# Patient Record
Sex: Male | Born: 1975 | Race: Black or African American | Hispanic: No | Smoking: Never smoker
Health system: Southern US, Community
[De-identification: ages and names within clinical notes are randomized; demographics above are authoritative.]

## PROBLEM LIST (undated history)

## (undated) DIAGNOSIS — E119 Type 2 diabetes mellitus without complications: Secondary | ICD-10-CM

---

## 2002-07-09 ENCOUNTER — Encounter: Payer: Self-pay | Admitting: Emergency Medicine

## 2002-07-09 ENCOUNTER — Emergency Department (HOSPITAL_COMMUNITY): Admission: EM | Admit: 2002-07-09 | Discharge: 2002-07-09 | Payer: Self-pay | Admitting: Emergency Medicine

## 2003-12-12 ENCOUNTER — Emergency Department (HOSPITAL_COMMUNITY): Admission: EM | Admit: 2003-12-12 | Discharge: 2003-12-12 | Payer: Self-pay | Admitting: *Deleted

## 2017-11-16 ENCOUNTER — Encounter (HOSPITAL_COMMUNITY): Payer: Self-pay | Admitting: *Deleted

## 2017-11-16 ENCOUNTER — Emergency Department (HOSPITAL_COMMUNITY): Payer: Medicaid Other

## 2017-11-16 ENCOUNTER — Other Ambulatory Visit: Payer: Self-pay

## 2017-11-16 ENCOUNTER — Inpatient Hospital Stay (HOSPITAL_COMMUNITY)
Admission: EM | Admit: 2017-11-16 | Discharge: 2017-11-27 | DRG: 617 | Disposition: A | Discharge status: Skilled Nursing Facility | Payer: Medicaid Other | Attending: Family Medicine | Admitting: Family Medicine

## 2017-11-16 DIAGNOSIS — E1140 Type 2 diabetes mellitus with diabetic neuropathy, unspecified: Secondary | ICD-10-CM | POA: Diagnosis present

## 2017-11-16 DIAGNOSIS — L089 Local infection of the skin and subcutaneous tissue, unspecified: Secondary | ICD-10-CM

## 2017-11-16 DIAGNOSIS — K567 Ileus, unspecified: Secondary | ICD-10-CM | POA: Diagnosis not present

## 2017-11-16 DIAGNOSIS — D62 Acute posthemorrhagic anemia: Secondary | ICD-10-CM | POA: Diagnosis not present

## 2017-11-16 DIAGNOSIS — E1152 Type 2 diabetes mellitus with diabetic peripheral angiopathy with gangrene: Secondary | ICD-10-CM | POA: Diagnosis present

## 2017-11-16 DIAGNOSIS — E1110 Type 2 diabetes mellitus with ketoacidosis without coma: Principal | ICD-10-CM | POA: Diagnosis present

## 2017-11-16 DIAGNOSIS — E871 Hypo-osmolality and hyponatremia: Secondary | ICD-10-CM | POA: Diagnosis present

## 2017-11-16 DIAGNOSIS — A049 Bacterial intestinal infection, unspecified: Secondary | ICD-10-CM | POA: Diagnosis not present

## 2017-11-16 DIAGNOSIS — R197 Diarrhea, unspecified: Secondary | ICD-10-CM | POA: Diagnosis not present

## 2017-11-16 DIAGNOSIS — L97529 Non-pressure chronic ulcer of other part of left foot with unspecified severity: Secondary | ICD-10-CM | POA: Diagnosis present

## 2017-11-16 DIAGNOSIS — R531 Weakness: Secondary | ICD-10-CM | POA: Diagnosis present

## 2017-11-16 DIAGNOSIS — E876 Hypokalemia: Secondary | ICD-10-CM | POA: Diagnosis present

## 2017-11-16 DIAGNOSIS — Z23 Encounter for immunization: Secondary | ICD-10-CM | POA: Diagnosis not present

## 2017-11-16 DIAGNOSIS — F1010 Alcohol abuse, uncomplicated: Secondary | ICD-10-CM | POA: Diagnosis present

## 2017-11-16 DIAGNOSIS — I96 Gangrene, not elsewhere classified: Secondary | ICD-10-CM

## 2017-11-16 DIAGNOSIS — L97519 Non-pressure chronic ulcer of other part of right foot with unspecified severity: Secondary | ICD-10-CM | POA: Diagnosis present

## 2017-11-16 DIAGNOSIS — Z833 Family history of diabetes mellitus: Secondary | ICD-10-CM

## 2017-11-16 DIAGNOSIS — E1169 Type 2 diabetes mellitus with other specified complication: Secondary | ICD-10-CM | POA: Diagnosis present

## 2017-11-16 DIAGNOSIS — E1165 Type 2 diabetes mellitus with hyperglycemia: Secondary | ICD-10-CM | POA: Diagnosis present

## 2017-11-16 DIAGNOSIS — R079 Chest pain, unspecified: Secondary | ICD-10-CM

## 2017-11-16 DIAGNOSIS — N179 Acute kidney failure, unspecified: Secondary | ICD-10-CM | POA: Diagnosis present

## 2017-11-16 DIAGNOSIS — B9620 Unspecified Escherichia coli [E. coli] as the cause of diseases classified elsewhere: Secondary | ICD-10-CM | POA: Diagnosis not present

## 2017-11-16 DIAGNOSIS — E0811 Diabetes mellitus due to underlying condition with ketoacidosis with coma: Secondary | ICD-10-CM

## 2017-11-16 DIAGNOSIS — E669 Obesity, unspecified: Secondary | ICD-10-CM | POA: Diagnosis present

## 2017-11-16 DIAGNOSIS — I10 Essential (primary) hypertension: Secondary | ICD-10-CM | POA: Diagnosis present

## 2017-11-16 DIAGNOSIS — R140 Abdominal distension (gaseous): Secondary | ICD-10-CM

## 2017-11-16 DIAGNOSIS — E11621 Type 2 diabetes mellitus with foot ulcer: Secondary | ICD-10-CM | POA: Diagnosis present

## 2017-11-16 DIAGNOSIS — E875 Hyperkalemia: Secondary | ICD-10-CM | POA: Diagnosis present

## 2017-11-16 DIAGNOSIS — E11628 Type 2 diabetes mellitus with other skin complications: Secondary | ICD-10-CM | POA: Diagnosis present

## 2017-11-16 DIAGNOSIS — R159 Full incontinence of feces: Secondary | ICD-10-CM | POA: Diagnosis not present

## 2017-11-16 DIAGNOSIS — Z6833 Body mass index (BMI) 33.0-33.9, adult: Secondary | ICD-10-CM

## 2017-11-16 DIAGNOSIS — R32 Unspecified urinary incontinence: Secondary | ICD-10-CM | POA: Diagnosis not present

## 2017-11-16 DIAGNOSIS — M869 Osteomyelitis, unspecified: Secondary | ICD-10-CM | POA: Diagnosis present

## 2017-11-16 DIAGNOSIS — E0810 Diabetes mellitus due to underlying condition with ketoacidosis without coma: Secondary | ICD-10-CM

## 2017-11-16 HISTORY — DX: Type 2 diabetes mellitus without complications: E11.9

## 2017-11-16 LAB — GLUCOSE, CAPILLARY
GLUCOSE-CAPILLARY: 295 mg/dL — AB (ref 65–99)
GLUCOSE-CAPILLARY: 292 mg/dL — AB (ref 65–99)
GLUCOSE-CAPILLARY: 229 mg/dL — AB (ref 65–99)
Glucose-Capillary: 435 mg/dL — ABNORMAL HIGH (ref 65–99)
Glucose-Capillary: 419 mg/dL — ABNORMAL HIGH (ref 65–99)
Glucose-Capillary: 244 mg/dL — ABNORMAL HIGH (ref 65–99)

## 2017-11-16 LAB — BASIC METABOLIC PANEL
Anion gap: 29 — ABNORMAL HIGH (ref 5–15)
Anion gap: 16 — ABNORMAL HIGH (ref 5–15)
Anion gap: 12 (ref 5–15)
BUN: 49 mg/dL — AB (ref 6–20)
BUN: 43 mg/dL — AB (ref 6–20)
BUN: 36 mg/dL — ABNORMAL HIGH (ref 6–20)
CALCIUM: 8.9 mg/dL (ref 8.9–10.3)
CHLORIDE: 95 mmol/L — AB (ref 101–111)
CHLORIDE: 103 mmol/L (ref 101–111)
CHLORIDE: 104 mmol/L (ref 101–111)
CO2: 7 mmol/L — ABNORMAL LOW (ref 22–32)
CO2: 14 mmol/L — ABNORMAL LOW (ref 22–32)
CO2: 20 mmol/L — ABNORMAL LOW (ref 22–32)
CREATININE: 2.35 mg/dL — AB (ref 0.61–1.24)
CREATININE: 1.73 mg/dL — AB (ref 0.61–1.24)
Calcium: 9.1 mg/dL (ref 8.9–10.3)
Calcium: 9.2 mg/dL (ref 8.9–10.3)
Creatinine, Ser: 1.21 mg/dL (ref 0.61–1.24)
GFR calc non Af Amer: 47 mL/min — ABNORMAL LOW (ref >60)
GFR calc non Af Amer: >60 mL/min (ref >60)
GFR, EST AFRICAN AMERICAN: 37 mL/min — AB (ref >60)
GFR, EST AFRICAN AMERICAN: 54 mL/min — AB (ref >60)
GFR, EST NON AFRICAN AMERICAN: 32 mL/min — AB (ref >60)
Glucose, Bld: 585 mg/dL (ref 65–99)
Glucose, Bld: 297 mg/dL — ABNORMAL HIGH (ref 65–99)
Glucose, Bld: 204 mg/dL — ABNORMAL HIGH (ref 65–99)
Potassium: 4.9 mmol/L (ref 3.5–5.1)
Potassium: 4.3 mmol/L (ref 3.5–5.1)
Potassium: 4.2 mmol/L (ref 3.5–5.1)
SODIUM: 131 mmol/L — AB (ref 135–145)
SODIUM: 133 mmol/L — AB (ref 135–145)
SODIUM: 136 mmol/L (ref 135–145)

## 2017-11-16 LAB — I-STAT CHEM 8, ED
BUN: 46 mg/dL — AB (ref 6–20)
BUN: 44 mg/dL — ABNORMAL HIGH (ref 6–20)
CALCIUM ION: 1.16 mmol/L (ref 1.15–1.40)
CALCIUM ION: 1.21 mmol/L (ref 1.15–1.40)
CHLORIDE: 102 mmol/L (ref 101–111)
Chloride: 95 mmol/L — ABNORMAL LOW (ref 101–111)
Creatinine, Ser: 1.6 mg/dL — ABNORMAL HIGH (ref 0.61–1.24)
Creatinine, Ser: 1.5 mg/dL — ABNORMAL HIGH (ref 0.61–1.24)
Glucose, Bld: >700 mg/dL (ref 65–99)
Glucose, Bld: 608 mg/dL (ref 65–99)
HEMATOCRIT: 49 % (ref 39.0–52.0)
HEMATOCRIT: 45 % (ref 39.0–52.0)
HEMOGLOBIN: 16.7 g/dL (ref 13.0–17.0)
HEMOGLOBIN: 15.3 g/dL (ref 13.0–17.0)
POTASSIUM: 5 mmol/L (ref 3.5–5.1)
Potassium: 6.4 mmol/L (ref 3.5–5.1)
SODIUM: 124 mmol/L — AB (ref 135–145)
SODIUM: 133 mmol/L — AB (ref 135–145)
TCO2: 8 mmol/L — AB (ref 22–32)
TCO2: 10 mmol/L — ABNORMAL LOW (ref 22–32)

## 2017-11-16 LAB — I-STAT VENOUS BLOOD GAS, ED
ACID-BASE DEFICIT: 23 mmol/L — AB (ref 0.0–2.0)
ACID-BASE DEFICIT: 20 mmol/L — AB (ref 0.0–2.0)
Bicarbonate: 5.9 mmol/L — ABNORMAL LOW (ref 20.0–28.0)
Bicarbonate: 7.6 mmol/L — ABNORMAL LOW (ref 20.0–28.0)
O2 SAT: 40 %
O2 Saturation: 54 %
PH VEN: 7.064 — AB (ref 7.250–7.430)
TCO2: 6 mmol/L — ABNORMAL LOW (ref 22–32)
TCO2: 8 mmol/L — AB (ref 22–32)
pCO2, Ven: 20.5 mmHg — ABNORMAL LOW (ref 44.0–60.0)
pCO2, Ven: 23.7 mmHg — ABNORMAL LOW (ref 44.0–60.0)
pH, Ven: 7.114 — CL (ref 7.250–7.430)
pO2, Ven: 39 mmHg (ref 32.0–45.0)
pO2, Ven: 30 mmHg — CL (ref 32.0–45.0)

## 2017-11-16 LAB — URINALYSIS, ROUTINE W REFLEX MICROSCOPIC
Bilirubin Urine: NEGATIVE
Glucose, UA: >=500 mg/dL — AB
Ketones, ur: 80 mg/dL — AB
Leukocytes, UA: NEGATIVE
Nitrite: NEGATIVE
PROTEIN: 30 mg/dL — AB
Specific Gravity, Urine: 1.014 (ref 1.005–1.030)
pH: 5 (ref 5.0–8.0)

## 2017-11-16 LAB — CBG MONITORING, ED
GLUCOSE-CAPILLARY: 591 mg/dL — AB (ref 65–99)
Glucose-Capillary: >600 mg/dL (ref 65–99)

## 2017-11-16 LAB — COMPREHENSIVE METABOLIC PANEL
ALT: 18 U/L (ref 17–63)
AST: 23 U/L (ref 15–41)
Albumin: 2.6 g/dL — ABNORMAL LOW (ref 3.5–5.0)
Alkaline Phosphatase: 171 U/L — ABNORMAL HIGH (ref 38–126)
BUN: 44 mg/dL — ABNORMAL HIGH (ref 6–20)
CO2: <7 mmol/L — ABNORMAL LOW (ref 22–32)
Calcium: 10 mg/dL (ref 8.9–10.3)
Chloride: 86 mmol/L — ABNORMAL LOW (ref 101–111)
Creatinine, Ser: 2.57 mg/dL — ABNORMAL HIGH (ref 0.61–1.24)
GFR calc Af Amer: 34 mL/min — ABNORMAL LOW (ref >60)
GFR calc non Af Amer: 29 mL/min — ABNORMAL LOW (ref >60)
Glucose, Bld: 842 mg/dL (ref 65–99)
Potassium: 6.1 mmol/L — ABNORMAL HIGH (ref 3.5–5.1)
Sodium: 125 mmol/L — ABNORMAL LOW (ref 135–145)
Total Bilirubin: 1.9 mg/dL — ABNORMAL HIGH (ref 0.3–1.2)
Total Protein: 9.2 g/dL — ABNORMAL HIGH (ref 6.5–8.1)

## 2017-11-16 LAB — CBC
HCT: 41.7 % (ref 39.0–52.0)
Hemoglobin: 14.3 g/dL (ref 13.0–17.0)
MCH: 30 pg (ref 26.0–34.0)
MCHC: 34.3 g/dL (ref 30.0–36.0)
MCV: 87.4 fL (ref 78.0–100.0)
Platelets: 642 10*3/uL — ABNORMAL HIGH (ref 150–400)
RBC: 4.77 MIL/uL (ref 4.22–5.81)
RDW: 12.8 % (ref 11.5–15.5)
WBC: 28.1 10*3/uL — ABNORMAL HIGH (ref 4.0–10.5)

## 2017-11-16 LAB — BLOOD GAS, ARTERIAL
ACID-BASE DEFICIT: 24.9 mmol/L — AB (ref 0.0–2.0)
Bicarbonate: 3.4 mmol/L — ABNORMAL LOW (ref 20.0–28.0)
Drawn by: 301361
FIO2: 21
O2 SAT: 96.8 %
PCO2 ART: 11 mmHg — AB (ref 32.0–48.0)
PH ART: 7.112 — AB (ref 7.350–7.450)
PO2 ART: 131 mmHg — AB (ref 83.0–108.0)
Patient temperature: 98.6

## 2017-11-16 LAB — LACTIC ACID, PLASMA
Lactic Acid, Venous: 2.4 mmol/L (ref 0.5–1.9)
Lactic Acid, Venous: 1.9 mmol/L (ref 0.5–1.9)

## 2017-11-16 LAB — I-STAT CG4 LACTIC ACID, ED
LACTIC ACID, VENOUS: 3.98 mmol/L — AB (ref 0.5–1.9)
Lactic Acid, Venous: 4.47 mmol/L (ref 0.5–1.9)

## 2017-11-16 LAB — ETHANOL: Alcohol, Ethyl (B): <10 mg/dL (ref <10)

## 2017-11-16 LAB — I-STAT TROPONIN, ED: TROPONIN I, POC: 0.02 ng/mL (ref 0.00–0.08)

## 2017-11-16 LAB — LIPASE, BLOOD: Lipase: 77 U/L — ABNORMAL HIGH (ref 11–51)

## 2017-11-16 LAB — PHOSPHORUS
PHOSPHORUS: 1.6 mg/dL — AB (ref 2.5–4.6)
Phosphorus: 4.2 mg/dL (ref 2.5–4.6)

## 2017-11-16 LAB — MRSA PCR SCREENING: MRSA BY PCR: NEGATIVE

## 2017-11-16 MED ORDER — SODIUM CHLORIDE 0.9 % IV BOLUS
1000.0000 mL | Freq: Once | INTRAVENOUS | Status: AC
  Administered 2017-11-16: 1000 mL via INTRAVENOUS

## 2017-11-16 MED ORDER — SODIUM CHLORIDE 0.9 % IV SOLN
INTRAVENOUS | Status: DC
  Administered 2017-11-16: 11.6 [IU]/h via INTRAVENOUS
  Administered 2017-11-16: 5.4 [IU]/h via INTRAVENOUS
  Administered 2017-11-17: 9.4 [IU]/h via INTRAVENOUS
  Filled 2017-11-16 (×4): qty 1

## 2017-11-16 MED ORDER — PNEUMOCOCCAL VAC POLYVALENT 25 MCG/0.5ML IJ INJ
0.5000 mL | INJECTION | INTRAMUSCULAR | Status: AC
  Administered 2017-11-17: 0.5 mL via INTRAMUSCULAR
  Filled 2017-11-16: qty 0.5

## 2017-11-16 MED ORDER — CHLORHEXIDINE GLUCONATE 0.12 % MT SOLN
15.0000 mL | Freq: Two times a day (BID) | OROMUCOSAL | Status: DC
  Administered 2017-11-16 – 2017-11-27 (×21): 15 mL via OROMUCOSAL
  Filled 2017-11-16 (×18): qty 15

## 2017-11-16 MED ORDER — SODIUM CHLORIDE 0.9 % IV SOLN
INTRAVENOUS | Status: DC
  Administered 2017-11-16: 12:00:00 via INTRAVENOUS

## 2017-11-16 MED ORDER — STERILE WATER FOR INJECTION IV SOLN
INTRAVENOUS | Status: DC
  Administered 2017-11-16: 11:00:00 via INTRAVENOUS
  Filled 2017-11-16 (×2): qty 850

## 2017-11-16 MED ORDER — SODIUM CHLORIDE 0.9 % IV SOLN
1.0000 g | Freq: Once | INTRAVENOUS | Status: AC
  Administered 2017-11-16: 1 g via INTRAVENOUS
  Filled 2017-11-16: qty 10

## 2017-11-16 MED ORDER — VANCOMYCIN HCL IN DEXTROSE 1-5 GM/200ML-% IV SOLN
1000.0000 mg | Freq: Two times a day (BID) | INTRAVENOUS | Status: DC
  Administered 2017-11-17: 1000 mg via INTRAVENOUS
  Filled 2017-11-16 (×2): qty 200

## 2017-11-16 MED ORDER — PIPERACILLIN-TAZOBACTAM 3.375 G IVPB 30 MIN
3.3750 g | Freq: Once | INTRAVENOUS | Status: AC
  Administered 2017-11-16: 3.375 g via INTRAVENOUS
  Filled 2017-11-16: qty 50

## 2017-11-16 MED ORDER — SODIUM CHLORIDE 0.9 % IV SOLN
INTRAVENOUS | Status: AC
  Administered 2017-11-16: 12:00:00 via INTRAVENOUS

## 2017-11-16 MED ORDER — METOPROLOL TARTRATE 5 MG/5ML IV SOLN
5.0000 mg | Freq: Three times a day (TID) | INTRAVENOUS | Status: DC
  Administered 2017-11-16 – 2017-11-20 (×12): 5 mg via INTRAVENOUS
  Filled 2017-11-16 (×13): qty 5

## 2017-11-16 MED ORDER — ALBUTEROL SULFATE (2.5 MG/3ML) 0.083% IN NEBU: 10.0000 mg | INHALATION_SOLUTION | Freq: Once | RESPIRATORY_TRACT | Status: DC

## 2017-11-16 MED ORDER — ORAL CARE MOUTH RINSE
15.0000 mL | Freq: Two times a day (BID) | OROMUCOSAL | Status: DC
  Administered 2017-11-16 – 2017-11-27 (×19): 15 mL via OROMUCOSAL

## 2017-11-16 MED ORDER — HYDRALAZINE HCL 20 MG/ML IJ SOLN: 10.0000 mg | INTRAMUSCULAR | Status: DC | PRN

## 2017-11-16 MED ORDER — DEXTROSE-NACL 5-0.45 % IV SOLN
INTRAVENOUS | Status: DC
  Administered 2017-11-16: 18:00:00 via INTRAVENOUS

## 2017-11-16 MED ORDER — VANCOMYCIN HCL 10 G IV SOLR
2500.0000 mg | Freq: Once | INTRAVENOUS | Status: AC
  Administered 2017-11-16: 2500 mg via INTRAVENOUS
  Filled 2017-11-16: qty 2500

## 2017-11-16 MED ORDER — POTASSIUM PHOSPHATES 15 MMOLE/5ML IV SOLN
30.0000 mmol | Freq: Once | INTRAVENOUS | Status: AC
  Administered 2017-11-16: 30 mmol via INTRAVENOUS
  Filled 2017-11-16: qty 10

## 2017-11-16 MED ORDER — HEPARIN SODIUM (PORCINE) 5000 UNIT/ML IJ SOLN
5000.0000 [IU] | Freq: Three times a day (TID) | INTRAMUSCULAR | Status: DC
  Administered 2017-11-16 – 2017-11-27 (×31): 5000 [IU] via SUBCUTANEOUS
  Filled 2017-11-16 (×34): qty 1

## 2017-11-16 MED ORDER — PIPERACILLIN-TAZOBACTAM 3.375 G IVPB
3.3750 g | Freq: Three times a day (TID) | INTRAVENOUS | Status: DC
  Administered 2017-11-16 – 2017-11-20 (×13): 3.375 g via INTRAVENOUS
  Filled 2017-11-16 (×16): qty 50

## 2017-11-16 MED ORDER — SODIUM CHLORIDE 0.9 % IV SOLN: INTRAVENOUS | Status: DC

## 2017-11-16 MED ORDER — SODIUM BICARBONATE 8.4 % IV SOLN
50.0000 meq | Freq: Once | INTRAVENOUS | Status: AC
  Administered 2017-11-16: 50 meq via INTRAVENOUS
  Filled 2017-11-16: qty 50

## 2017-11-16 MED ORDER — DEXTROSE-NACL 5-0.45 % IV SOLN: INTRAVENOUS | Status: DC

## 2017-11-16 NOTE — ED Notes (Signed)
Called main pharmacy to have insulin sent for this pt.

## 2017-11-16 NOTE — ED Triage Notes (Signed)
Pt in via EMS to triage c/o generalized body aches and weakness for the last few days, also diarrhea, CBG 398, denies n/v or chest pain, no distress noted

## 2017-11-16 NOTE — H&P (Signed)
PULMONARY / CRITICAL CARE MEDICINE   Name: Ian Bond MRN: [id number] DOB: 15-Dec-1975    ADMISSION DATE:  11/16/2017 CONSULTATION DATE:  11/16/2017  REFERRING MD:  EDP - Mackuen  CHIEF COMPLAINT:  Severe diabetic ketoacidosis and bilateral diabetic foot infections  HISTORY OF PRESENT ILLNESS:   Mr. Ian Bond is a 42 y/o man who does not follow regularly with physicians for 20 years and takes no medications at home who came to the ED for worsening shortness of breath today and found to be in severe DKA. He states feeling in his usual health until Easter Sunday when he started to have nausea and vomiting and then some shortness of breath. He attributed this to seasonal allergies and took OTC antihistamines. This became more severe today so he called out from work and came to the ED for evaluation. He was found to be severely acidotic with [place] respirations for compensation with a glucose >700 and also to have bilateral foot infections. He was started on broad spectrum antibiotics, insulin infusion, and IV bicarbonate. PCCM was consulted for admission due to severe acidosis at risk for respiratory decompensation.  PAST MEDICAL HISTORY :  He  has a past medical history of Diabetes mellitus without complication ([place]).  PAST SURGICAL HISTORY: He  has no past surgical history on file.  No Known Allergies  No current facility-administered medications on file prior to encounter.    No current outpatient medications on file prior to encounter.    FAMILY HISTORY:  His indicated that his maternal uncle is deceased.   SOCIAL HISTORY: He  reports that he has never smoked. He does not have any smokeless tobacco history on file.  REVIEW OF SYSTEMS:   Review of Systems  Constitutional: Positive for malaise/fatigue. Negative for chills and fever.  HENT: Negative for sore throat.   Eyes: Positive for blurred vision.  Respiratory: Positive for shortness of breath. Negative for  stridor.   Cardiovascular: Negative for chest pain.  Gastrointestinal: Positive for nausea and vomiting. Negative for diarrhea.  Genitourinary: Positive for frequency. Negative for dysuria.  Musculoskeletal: Negative for falls.  Skin: Negative for rash.  Neurological: Negative for loss of consciousness.  Endo/Heme/Allergies: Positive for environmental allergies.  Psychiatric/Behavioral: Positive for substance abuse.     VITAL SIGNS: BP (!) 178/106   Pulse (!) 107   Temp (S) 100.1 F (37.8 C) (Rectal)   Resp (!) 33   Ht 5\' 10"  (1.778 m)   Wt 280 lb (127 kg)   SpO2 100%   BMI 40.18 kg/m   HEMODYNAMICS:    VENTILATOR SETTINGS:    INTAKE / OUTPUT: No intake/output data recorded.  PHYSICAL EXAMINATION: General:  Obese african [place] male, awake but tired and uncomfortable Neuro:  No focal CN deficits, 5/5 strength in all extremities HEENT:  Prominent lingual fissures, Conjunctivae are clear, PERRL, flat jugular veins, no adenopathy Cardiovascular:  Tachycardic with a regular rhythm, no murmur Lungs:  CTAB, tachypneic Abdomen:  Soft, nontender Musculoskeletal:  Wet gangrene appearance of medial toes on left foot and also on right limited to 2nd digit. There is proximal extension of erythema and pain is disproportionately mild on exam.        Skin:  Cellulitis as above.  LABS:  BMET Recent Labs  Lab 11/16/17 0825 11/16/17 1045 11/16/17 1215  NA 125* 124* 133*  K 6.1* 6.4* 5.0  CL 86* 95* 102  CO2 <7*  --   --   BUN 44* 46* 44*  CREATININE 2.57*  1.60* 1.50*  GLUCOSE 842* >700* 608*    Electrolytes Recent Labs  Lab 11/16/17 0825  CALCIUM 10.0    CBC Recent Labs  Lab 11/16/17 0825 11/16/17 1045 11/16/17 1215  WBC 28.1*  --   --   HGB 14.3 16.7 15.3  HCT 41.7 49.0 45.0  PLT 642*  --   --     Coag's No results for input(s): APTT, INR in the last 168 hours.  Sepsis Markers Recent Labs  Lab 11/16/17 0905 11/16/17 1029  LATICACIDVEN  4.47* 3.98*    ABG Recent Labs  Lab 11/16/17 1010  PHART 7.112*  PCO2ART 11.0*  PO2ART 131*    Liver Enzymes Recent Labs  Lab 11/16/17 0825  AST 23  ALT 18  ALKPHOS 171*  BILITOT 1.9*  ALBUMIN 2.6*    Cardiac Enzymes No results for input(s): TROPONINI, PROBNP in the last 168 hours.  Glucose Recent Labs  Lab 11/16/17 0922 11/16/17 1058 11/16/17 1215  GLUCAP >600* >600* 591*    STUDIES:  Imaging 4/25 Dg Chest Port 1 View No edema or consolidation.  4/25 Dg Foot 2 Views Left Extensive soft tissue gas in the left great toe and along the dorsum of the foot. No definite changes of osteomyelitis noted.  11/16/2017 Dg Foot 2 Views Right Soft tissue swelling and subcutaneous emphysema in the second toe with the extension to the distal metatarsal, consistent with necrotizing infection. Osteomyelitis of the second distal phalanx.  CULTURES: Blood cultures 4/25 >>  ANTIBIOTICS: Vanc 4/25 >> Zosyn 4/25 >>  SIGNIFICANT EVENTS: 4/25 Admitted with severe DKA and bilateral foot infections  LINES/TUBES: PIVs  DISCUSSION: Mr. Ian Bond is a 42 y/o previously undiagnosed/untreated diabetic now presenting in severe DKA apparently secondary to his severe bilateral foot infections. Initial imaging is highly concerning for necrotizing infection with subcutaneous gas.  ASSESSMENT / PLAN:  PULMONARY A: Strong but incomplete respiratory compensation for severe metabolic acidosis. He has no need for respiratory support unless became too tired to compensate P:   Monitoring for continued progress compensating  CARDIOVASCULAR A:  Hypertensive and tachycardic consistent with infection, dehydration Fair peripheral circulation with palpable DP pulses and normal cap refill excent in gangrenous toes. No EKG changes with hyperkalemia to 6.4 P:  Continuous monitoring Can start antihypertensives if needed after fluid replacement F/U LA Repeat EKG in AM  RENAL A:   Profound  metabolic acidosis 2/2 DKA Also new presumed AKI, no baseline SCr for comparison P:   IVF at bolus rates until 3 or 4 liters Follow strict I/Os On bicarb gtt at 125cc/hr Repeat Bmet q4hrs Check Phos until correction  GASTROINTESTINAL A:   N/V reported PTA P:   NPO Can give oral care/observed chips for dry mouth  HEMATOLOGIC A:   Leukocytosis at 28.1 Plts 642 consistent with acute phase reactant P:  Repeat daily CBC  INFECTIOUS A:   Bilateral foot infections - wet gangrene appearance ?Necrotizing fasciitis with subcutaneous gas and uncertain start time P:   Continue vanc/zosyn Check blood Cx x2 Orthopedic surgery consultation for definitive wound management Follow fever, WBC HIV screen  ENDOCRINE A:   New diabetic in severe DKA P:   Insulin gtt, phase 1 DKA protocol IVF q1hr CBGs  NEUROLOGIC A:   No acute abnormalities Sensation in feet to light touch seems good so question why active infections aren't more painful   FAMILY  - Updates: Fiance at bedside in ED 4/25  - Inter-disciplinary family meet or Palliative Care meeting due by:  [place], MD PGY-III Internal Medicine Resident Pager# [phone] 11/16/2017, 12:51 PM

## 2017-11-16 NOTE — ED Notes (Signed)
Critical care MD at bedside 

## 2017-11-16 NOTE — Progress Notes (Signed)
CRITICAL VALUE ALERT  Critical Value:  Lactic 2.4  Date & Time Notied:  4/25 1504  Provider Notified: Yes  Orders Received/Actions taken: None at this time

## 2017-11-16 NOTE — Consult Note (Addendum)
Reason for Consult:Bilateral foot ulcers Referring Physician: C MacKuen  Ian Bond is an 42 y.o. male.  HPI: Ian Bond came in in DKA. When they took his socks off he was noted to have extensive ulceration and gangrene. He thinks his feet have been that way for a month or two. He has DM and HTN (newly diagnosed) but untreated. His feet don't really bother him.   Past Medical History:  Diagnosis Date  . Diabetes mellitus without complication ([place])     Family History  Problem Relation Age of Onset  . Diabetes Maternal Uncle     Social History:  reports that he has never smoked. He drinks daily. No illicit drugs.  Allergies: No Known Allergies  Medications: I have reviewed the patient's current medications.  Results for orders placed or performed during the hospital encounter of 11/16/17 (from the past 48 hour(s))  Urinalysis, Routine w reflex microscopic     Status: Abnormal   Collection Time: 11/16/17  8:13 AM  Result Value Ref Range   Color, Urine YELLOW YELLOW   APPearance HAZY (A) CLEAR   Specific Gravity, Urine 1.014 1.005 - 1.030   pH 5.0 5.0 - 8.0   Glucose, UA >=500 (A) NEGATIVE mg/dL   Hgb urine dipstick MODERATE (A) NEGATIVE   Bilirubin Urine NEGATIVE NEGATIVE   Ketones, ur 80 (A) NEGATIVE mg/dL   Protein, ur 30 (A) NEGATIVE mg/dL   Nitrite NEGATIVE NEGATIVE   Leukocytes, UA NEGATIVE NEGATIVE   RBC / HPF 0-5 0 - 5 RBC/hpf   WBC, UA 0-5 0 - 5 WBC/hpf   Bacteria, UA FEW (A) NONE SEEN   Squamous Epithelial / LPF 0-5 0 - 5    Comment: Please note change in reference range.   Mucus PRESENT    Hyaline Casts, UA PRESENT    Granular Casts, UA PRESENT     Comment: Performed at [facility], [place] [address], [place], [place] [address]  Lipase, blood     Status: Abnormal   Collection Time: 11/16/17  8:25 AM  Result Value Ref Range   Lipase 77 (H) 11 - 51 U/L    Comment: Performed at [facility] [address], [place], [place] [address]   Comprehensive metabolic panel     Status: Abnormal   Collection Time: 11/16/17  8:25 AM  Result Value Ref Range   Sodium 125 (L) 135 - 145 mmol/L   Potassium 6.1 (H) 3.5 - 5.1 mmol/L   Chloride 86 (L) 101 - 111 mmol/L   CO2 <7 (L) 22 - 32 mmol/L   Glucose, Bld 842 (HH) 65 - 99 mg/dL    Comment: CRITICAL RESULT CALLED TO, READ BACK BY AND VERIFIED WITH: C MARSHALL RN [phone] 11/16/2017 BY A BENNETT    BUN 44 (H) 6 - 20 mg/dL   Creatinine, Ser 2.57 (H) 0.61 - 1.24 mg/dL   Calcium 10.0 8.9 - 10.3 mg/dL   Total Protein 9.2 (H) 6.5 - 8.1 g/dL   Albumin 2.6 (L) 3.5 - 5.0 g/dL   AST 23 15 - 41 U/L   ALT 18 17 - 63 U/L   Alkaline Phosphatase 171 (H) 38 - 126 U/L   Total Bilirubin 1.9 (H) 0.3 - 1.2 mg/dL   GFR calc non Af Amer 29 (L) >60 mL/min   GFR calc Af Amer 34 (L) >60 mL/min    Comment: (NOTE) The eGFR has been calculated using the CKD EPI equation. This calculation has not been validated  in all clinical situations. eGFR's persistently <60 mL/min signify possible Chronic Kidney Disease.    Anion gap NOT CALCULATED 5 - 15    Comment: Performed at [facility] [address], [place], [place] [address]  CBC     Status: Abnormal   Collection Time: 11/16/17  8:25 AM  Result Value Ref Range   WBC 28.1 (H) 4.0 - 10.5 K/uL    Comment: REPEATED TO VERIFY   RBC 4.77 4.22 - 5.81 MIL/uL   Hemoglobin 14.3 13.0 - 17.0 g/dL   HCT 41.7 39.0 - 52.0 %   MCV 87.4 78.0 - 100.0 fL   MCH 30.0 26.0 - 34.0 pg   MCHC 34.3 30.0 - 36.0 g/dL   RDW 12.8 11.5 - 15.5 %   Platelets 642 (H) 150 - 400 K/uL    Comment: Performed at [facility], [place] [address], [place], [place] [address]  I-Stat CG4 Lactic Acid, ED     Status: Abnormal   Collection Time: 11/16/17  9:05 AM  Result Value Ref Range   Lactic Acid, Venous 4.47 (HH) 0.5 - 1.9 mmol/L   Comment NOTIFIED PHYSICIAN   Ethanol     Status: None   Collection Time: 11/16/17  9:17 AM  Result Value Ref Range   Alcohol, Ethyl (B) <10 <10  mg/dL    Comment:        LOWEST DETECTABLE LIMIT FOR SERUM ALCOHOL IS 10 mg/dL FOR MEDICAL PURPOSES ONLY Performed at [facility], [place] [address], [place], [place] [address]   CBG monitoring, ED     Status: Abnormal   Collection Time: 11/16/17  9:22 AM  Result Value Ref Range   Glucose-Capillary >600 (HH) 65 - 99 mg/dL  Blood gas, arterial     Status: Abnormal   Collection Time: 11/16/17 10:10 AM  Result Value Ref Range   FIO2 21.00    Delivery systems ROOM AIR    pH, Arterial 7.112 (LL) 7.350 - 7.450    Comment: CRITICAL RESULT CALLED TO, READ BACK BY AND VERIFIED WITH: A.HOLCOMB,RRT,RCP AT 1028 BY M.CAMPBELL,RRT,RCP ON 11/16/17    pCO2 arterial 11.0 (LL) 32.0 - 48.0 mmHg    Comment: CRITICAL RESULT CALLED TO, READ BACK BY AND VERIFIED WITH: A.HOLCOMB,RRT,RCP AT 1028 BY M.CAMPBELL,RRT,RCP ON 11/16/17    pO2, Arterial 131 (H) 83.0 - 108.0 mmHg   Bicarbonate 3.4 (L) 20.0 - 28.0 mmol/L   Acid-base deficit 24.9 (H) 0.0 - 2.0 mmol/L   O2 Saturation 96.8 %   Patient temperature 98.6    Collection site LEFT BRACHIAL    Drawn by [id number]    Sample type ARTERIAL DRAW    Allens test (pass/fail) PASS PASS  I-stat troponin, ED     Status: None   Collection Time: 11/16/17 10:27 AM  Result Value Ref Range   Troponin i, poc 0.02 0.00 - 0.08 ng/mL   Comment 3            Comment: Due to the release kinetics of cTnI, a negative result within the first hours of the onset of symptoms does not rule out myocardial infarction with certainty. If myocardial infarction is still suspected, repeat the test at appropriate intervals.   I-Stat venous blood gas, ED     Status: Abnormal   Collection Time: 11/16/17 10:28 AM  Result Value Ref Range   pH, Ven 7.064 (LL) 7.250 - 7.430   pCO2, Ven 20.5 (L) 44.0 - 60.0 mmHg   pO2,  Ven 39.0 32.0 - 45.0 mmHg   Bicarbonate 5.9 (L) 20.0 - 28.0 mmol/L   TCO2 6 (L) 22 - 32 mmol/L   O2 Saturation 54.0 %   Acid-base deficit 23.0 (H) 0.0 - 2.0 mmol/L    Patient temperature HIDE    Sample type VENOUS    Comment NOTIFIED PHYSICIAN   I-Stat CG4 Lactic Acid, ED     Status: Abnormal   Collection Time: 11/16/17 10:29 AM  Result Value Ref Range   Lactic Acid, Venous 3.98 (HH) 0.5 - 1.9 mmol/L   Comment NOTIFIED PHYSICIAN   I-Stat Chem 8, ED     Status: Abnormal   Collection Time: 11/16/17 10:45 AM  Result Value Ref Range   Sodium 124 (L) 135 - 145 mmol/L   Potassium 6.4 (HH) 3.5 - 5.1 mmol/L   Chloride 95 (L) 101 - 111 mmol/L   BUN 46 (H) 6 - 20 mg/dL   Creatinine, Ser 1.60 (H) 0.61 - 1.24 mg/dL   Glucose, Bld >700 (HH) 65 - 99 mg/dL   Calcium, Ion 1.16 1.15 - 1.40 mmol/L   TCO2 8 (L) 22 - 32 mmol/L   Hemoglobin 16.7 13.0 - 17.0 g/dL   HCT 49.0 39.0 - 52.0 %  CBG monitoring, ED     Status: Abnormal   Collection Time: 11/16/17 10:58 AM  Result Value Ref Range   Glucose-Capillary >600 (HH) 65 - 99 mg/dL   Comment 1 Notify RN    Comment 2 Document in Chart   I-Stat Chem 8, ED     Status: Abnormal   Collection Time: 11/16/17 12:15 PM  Result Value Ref Range   Sodium 133 (L) 135 - 145 mmol/L   Potassium 5.0 3.5 - 5.1 mmol/L   Chloride 102 101 - 111 mmol/L   BUN 44 (H) 6 - 20 mg/dL   Creatinine, Ser 1.50 (H) 0.61 - 1.24 mg/dL   Glucose, Bld 608 (HH) 65 - 99 mg/dL   Calcium, Ion 1.21 1.15 - 1.40 mmol/L   TCO2 10 (L) 22 - 32 mmol/L   Hemoglobin 15.3 13.0 - 17.0 g/dL   HCT 45.0 39.0 - 52.0 %   Comment NOTIFIED PHYSICIAN   CBG monitoring, ED     Status: Abnormal   Collection Time: 11/16/17 12:15 PM  Result Value Ref Range   Glucose-Capillary 591 (HH) 65 - 99 mg/dL   Comment 1 Notify RN    Comment 2 Document in Chart   I-Stat venous blood gas, ED     Status: Abnormal   Collection Time: 11/16/17 12:15 PM  Result Value Ref Range   pH, Ven 7.114 (LL) 7.250 - 7.430   pCO2, Ven 23.7 (L) 44.0 - 60.0 mmHg   pO2, Ven 30.0 (LL) 32.0 - 45.0 mmHg   Bicarbonate 7.6 (L) 20.0 - 28.0 mmol/L   TCO2 8 (L) 22 - 32 mmol/L   O2 Saturation 40.0 %    Acid-base deficit 20.0 (H) 0.0 - 2.0 mmol/L   Patient temperature HIDE    Sample type VENOUS    Comment NOTIFIED PHYSICIAN     Dg Chest Port 1 View  Result Date: 11/16/2017 CLINICAL DATA:  Weakness and generalized pain EXAM: PORTABLE CHEST 1 VIEW COMPARISON:  None. FINDINGS: Lungs are clear. Heart size and pulmonary vascularity are normal. No adenopathy. No bone lesions. IMPRESSION: No edema or consolidation. Electronically Signed   By: Lowella Grip III M.D.   On: 11/16/2017 11:22   Dg Foot 2 Views Left  Result  Date: 11/16/2017 CLINICAL DATA:  Ulcers EXAM: LEFT FOOT - 2 VIEW COMPARISON:  None. FINDINGS: Moderate to advanced degenerative changes at the 1st MTP joint and IP joint. Soft tissue gas noted within the great toe soft tissues and extending along the dorsum of the foot. No definite bony changes of osteomyelitis. No fracture, subluxation or dislocation. IMPRESSION: Extensive soft tissue gas in the left great toe and along the dorsum of the foot. No definite changes of osteomyelitis noted. If there is high clinical suspicion for osteomyelitis, MRI would be more sensitive. Electronically Signed   By: Rolm Baptise M.D.   On: 11/16/2017 11:23   Dg Foot 2 Views Right  Result Date: 11/16/2017 CLINICAL DATA:  Diabetic foot ulcers. EXAM: RIGHT FOOT - 2 VIEW COMPARISON:  None. FINDINGS: No acute fracture or dislocation. Osteolysis of the second distal phalanx. Prominent soft tissue swelling of the second toe with dorsal subcutaneous emphysema extending to the distal metatarsal. Mild osteoarthritis of the first MTP joint. IMPRESSION: 1. Soft tissue swelling and subcutaneous emphysema in the second toe with the extension to the distal metatarsal, consistent with necrotizing infection. Osteomyelitis of the second distal phalanx. Electronically Signed   By: Titus Dubin M.D.   On: 11/16/2017 11:26    Review of Systems  Constitutional: Negative for weight loss.  HENT: Negative for ear discharge,  ear pain, hearing loss and tinnitus.   Eyes: Negative for blurred vision, double vision, photophobia and pain.  Respiratory: Negative for cough, sputum production and shortness of breath.   Cardiovascular: Negative for chest pain.  Gastrointestinal: Positive for diarrhea, nausea and vomiting. Negative for abdominal pain.  Genitourinary: Negative for dysuria, flank pain, frequency and urgency.  Musculoskeletal: Negative for back pain, falls, joint pain, myalgias and neck pain.  Neurological: Negative for dizziness, tingling, sensory change, focal weakness, loss of consciousness and headaches.  Endo/Heme/Allergies: Does not bruise/bleed easily.  Psychiatric/Behavioral: Negative for depression, memory loss and substance abuse. The patient is not nervous/anxious.    Blood pressure (!) 178/109, pulse (!) 107, temperature (S) 100.1 F (37.8 C), temperature source (S) Rectal, resp. rate 19, height _0  (1.778 m), weight 127 kg (280 lb), SpO2 100 %. Physical Exam  Constitutional: He appears well-developed and well-nourished. He appears lethargic. No distress.  HENT:  Head: Normocephalic and atraumatic.  Eyes: Conjunctivae are normal. Right eye exhibits no discharge. Left eye exhibits no discharge. No scleral icterus.  Neck: Normal range of motion.  Cardiovascular: Regular rhythm. Tachycardia present.  Respiratory: Accessory muscle usage present. No respiratory distress.  Musculoskeletal:  RLE No traumatic wounds, ecchymosis, or rash  Second toe gangrenous, malodorous, SQE noted  No knee or ankle effusion  Knee stable to varus/ valgus and anterior/posterior stress  Sens DPN, SPN, TN intact  Motor EHL, ext, flex, evers 5/5  DP 2+, PT 2+, 2+ NP edema  LLE No traumatic wounds, ecchymosis, or rash  Great toe fusiform edema, gangrenous, malodorous, SQE noted, SQ purulence evident  No knee or ankle effusion  Knee stable to varus/ valgus and anterior/posterior stress  Sens DPN, SPN, TN  intact  Motor EHL, ext, flex, evers 5/5  DP 2+, PT 2+, 3+ NP edema  Neurological: He appears lethargic.  Skin: Skin is warm and dry. He is not diaphoretic.  Psychiatric: He has a normal mood and affect. His behavior is normal.        Assessment/Plan: Bilateral foot ulcers -- Will get MR's to help with surgical planning and to help convince pt of  extent of disease. Will make NPO tonight. Dr. Sharol Given to evaluate tonight or tomorrow morning. DM in DKA HTN EtOH abuse    Lisette Abu, PA-C Orthopedic Surgery [phone] 11/16/2017, 12:52 PM

## 2017-11-16 NOTE — Progress Notes (Signed)
Pharmacy Antibiotic Note  Ian Bond is a 42 y.o. male admitted on 11/16/2017 with osteomyelitis.  Pharmacy has been consulted for vancomycin/zosyn dosing. SCr 1.6 on admit, normalized CrCl~60.  Plan: Vancomycin 2500mg  IV x 1; then 1g IV q12h Zosyn 3.375g IV (62min inf) x1; then 3.375g IV q8h (4h inf) Monitor clinical progress, c/s, renal function F/u de-escalation plan/LOT, vancomycin trough as indicated   Height: 5\' 10"  (177.8 cm) Weight: 280 lb (127 kg) IBW/kg (Calculated) : 73  Temp (24hrs), Avg:97.9 F (36.6 C), Min:97.9 F (36.6 C), Max:97.9 F (36.6 C)  Recent Labs  Lab 11/16/17 0825 11/16/17 0905 11/16/17 1029  WBC 28.1*  --   --   CREATININE 2.57*  --   --   LATICACIDVEN  --  4.47* 3.98*    Estimated Creatinine Clearance: 49.6 mL/min (A) (by C-G formula based on SCr of 2.57 mg/dL (H)).    No Known Allergies  Elicia Lamp, PharmD, BCPS Clinical Pharmacist 11/16/2017 10:50 AM

## 2017-11-16 NOTE — ED Notes (Signed)
Pt aware we need a urine specimen. 

## 2017-11-16 NOTE — Progress Notes (Signed)
Patient not feeling well.  Just gave forms and hope to be ready to do AD on Friday. Conard Novak, Chaplain   11/16/17 1900  Clinical Encounter Type  Visited With Patient  Visit Type Initial;Other (Comment) (gave AD forms only)  Referral From Physician  Consult/Referral To Chaplain

## 2017-11-16 NOTE — ED Provider Notes (Signed)
[facility] EMERGENCY DEPARTMENT Provider Note   CSN: [id number] Arrival date & time: 11/16/17  6237     History   Chief Complaint Chief Complaint  Patient presents with  . Weakness  . Diarrhea    HPI Ian Bond is a 42 y.o. male.  HPI   42 year old male with history of diabetes brought here via EMS for evaluation of generalized weakness.  History is limited as patient is very uncomfortable.  Part of history is obtained through fianc who is at bedside.  Patient report having sinus pain ongoing for the past week.  For the past several days he also endorsed increased thirst and urination as well as generalized weakness, abdominal discomfort, recurrent nausea vomiting and diarrhea.  Fiance also noticed heavy breathing since last night, along with excessive thirst, and EMS was contacted and brought patient here.  According to the patient and family member, no document history of diabetes.  He is a heavy drinker and drinks 3-4 beers daily, as well as liquor on the weekend.  He does not complain of any significant abdominal pain or dysuria.  He did report having "sinus infection" with pain to his sinuses.  History is limited.  Pt hasn['t seen a PCP for many years. Level V caveat for AMS.  Past Medical History:  Diagnosis Date  . Diabetes mellitus without complication ([place])     There are no active problems to display for this patient.    The histories are not reviewed yet. Please review them in the "History" navigator section and refresh this [place].      Home Medications    Prior to Admission medications   Not on File    Family History Family History  Problem Relation Age of Onset  . Diabetes Maternal Uncle     Social History Social History   Tobacco Use  . Smoking status: Never Smoker  Substance Use Topics  . Alcohol use: Not on file  . Drug use: Not on file     Allergies   Patient has no known allergies.   Review of  Systems Review of Systems  Unable to perform ROS: Mental status change     Physical Exam Updated Vital Signs BP (!) 178/106   Pulse (!) 107   Temp (S) 100.1 F (37.8 C) (Rectal)   Resp (!) 33   Ht 5\' 10"  (1.778 m)   Wt 127 kg (280 lb)   SpO2 100%   BMI 40.18 kg/m   Physical Exam  Constitutional: He appears well-developed and well-nourished.  Patient with Kussmal breathing, altered, ill-appearing  HENT:  Head: Atraumatic.  Right Ear: External ear normal.  Left Ear: External ear normal.  Mouth is extremely dry  Eyes: Pupils are equal, round, and reactive to light. Conjunctivae and EOM are normal.  Neck: Neck supple.  No nuchal rigidity  Cardiovascular:  Tachycardia without murmur rubs  Pulmonary/Chest: He is in respiratory distress.  Kussmaul breathing, no wheezes, rales, rhonchi  Abdominal: Soft. Bowel sounds are normal. He exhibits no distension. There is no tenderness.  Musculoskeletal: He exhibits no tenderness.  Neurological:  Drowsy but easily arousable, able to answer question.  Skin: No rash noted.  pt has significant gangrene toes involving both feet.  R foot with macerated, foul odor and gangrene involving 2nd toe, and on the L foot significant gangrene L great toe and 2nd toes extending to metatarsal region (ball of foot). DP pulse palpable bilaterally.  Psychiatric: He has a normal mood and  affect.  Nursing note and vitals reviewed.    ED Treatments / Results  Labs (all labs ordered are listed, but only abnormal results are displayed) Labs Reviewed  LIPASE, BLOOD - Abnormal; Notable for the following components:      Result Value   Lipase 77 (*)    All other components within normal limits  COMPREHENSIVE METABOLIC PANEL - Abnormal; Notable for the following components:   Sodium 125 (*)    Potassium 6.1 (*)    Chloride 86 (*)    CO2 <7 (*)    Glucose, Bld 842 (*)    BUN 44 (*)    Creatinine, Ser 2.57 (*)    Total Protein 9.2 (*)    Albumin 2.6  (*)    Alkaline Phosphatase 171 (*)    Total Bilirubin 1.9 (*)    GFR calc non Af Amer 29 (*)    GFR calc Af Amer 34 (*)    All other components within normal limits  CBC - Abnormal; Notable for the following components:   WBC 28.1 (*)    Platelets 642 (*)    All other components within normal limits  URINALYSIS, ROUTINE W REFLEX MICROSCOPIC - Abnormal; Notable for the following components:   APPearance HAZY (*)    Glucose, UA >=500 (*)    Hgb urine dipstick MODERATE (*)    Ketones, ur 80 (*)    Protein, ur 30 (*)    Bacteria, UA FEW (*)    All other components within normal limits  BLOOD GAS, ARTERIAL - Abnormal; Notable for the following components:   pH, Arterial 7.112 (*)    pCO2 arterial 11.0 (*)    pO2, Arterial 131 (*)    Bicarbonate 3.4 (*)    Acid-base deficit 24.9 (*)    All other components within normal limits  I-STAT CG4 LACTIC ACID, ED - Abnormal; Notable for the following components:   Lactic Acid, Venous 4.47 (*)    All other components within normal limits  CBG MONITORING, ED - Abnormal; Notable for the following components:   Glucose-Capillary >600 (*)    All other components within normal limits  I-STAT CG4 LACTIC ACID, ED - Abnormal; Notable for the following components:   Lactic Acid, Venous 3.98 (*)    All other components within normal limits  I-STAT VENOUS BLOOD GAS, ED - Abnormal; Notable for the following components:   pH, Ven 7.064 (*)    pCO2, Ven 20.5 (*)    Bicarbonate 5.9 (*)    TCO2 6 (*)    Acid-base deficit 23.0 (*)    All other components within normal limits  I-STAT CHEM 8, ED - Abnormal; Notable for the following components:   Sodium 124 (*)    Potassium 6.4 (*)    Chloride 95 (*)    BUN 46 (*)    Creatinine, Ser 1.60 (*)    Glucose, Bld >700 (*)    TCO2 8 (*)    All other components within normal limits  CBG MONITORING, ED - Abnormal; Notable for the following components:   Glucose-Capillary >600 (*)    All other components  within normal limits  I-STAT CHEM 8, ED - Abnormal; Notable for the following components:   Sodium 133 (*)    BUN 44 (*)    Creatinine, Ser 1.50 (*)    Glucose, Bld 608 (*)    TCO2 10 (*)    All other components within normal limits  CBG MONITORING, ED - Abnormal;  Notable for the following components:   Glucose-Capillary 591 (*)    All other components within normal limits  I-STAT VENOUS BLOOD GAS, ED - Abnormal; Notable for the following components:   pH, Ven 7.114 (*)    pCO2, Ven 23.7 (*)    pO2, Ven 30.0 (*)    Bicarbonate 7.6 (*)    TCO2 8 (*)    Acid-base deficit 20.0 (*)    All other components within normal limits  ETHANOL  BLOOD GAS, VENOUS  BASIC METABOLIC PANEL  BASIC METABOLIC PANEL  BASIC METABOLIC PANEL  BASIC METABOLIC PANEL  PHOSPHORUS  PHOSPHORUS  HIV ANTIBODY (ROUTINE TESTING)  I-STAT TROPONIN, ED    EKG None  ED ECG REPORT   Date: 11/16/2017  Rate: 117  Rhythm: sinus tachycardia  QRS Axis: left  Intervals: QT prolonged  ST/T Wave abnormalities: nonspecific ST changes  Conduction Disutrbances:none  Narrative Interpretation:   Old EKG Reviewed: none available  I have personally reviewed the EKG tracing and agree with the computerized printout as noted.    Radiology Dg Chest Port 1 View  Result Date: 11/16/2017 CLINICAL DATA:  Weakness and generalized pain EXAM: PORTABLE CHEST 1 VIEW COMPARISON:  None. FINDINGS: Lungs are clear. Heart size and pulmonary vascularity are normal. No adenopathy. No bone lesions. IMPRESSION: No edema or consolidation. Electronically Signed   By: Lowella Grip III M.D.   On: 11/16/2017 11:22   Dg Foot 2 Views Left  Result Date: 11/16/2017 CLINICAL DATA:  Ulcers EXAM: LEFT FOOT - 2 VIEW COMPARISON:  None. FINDINGS: Moderate to advanced degenerative changes at the 1st MTP joint and IP joint. Soft tissue gas noted within the great toe soft tissues and extending along the dorsum of the foot. No definite bony changes  of osteomyelitis. No fracture, subluxation or dislocation. IMPRESSION: Extensive soft tissue gas in the left great toe and along the dorsum of the foot. No definite changes of osteomyelitis noted. If there is high clinical suspicion for osteomyelitis, MRI would be more sensitive. Electronically Signed   By: Rolm Baptise M.D.   On: 11/16/2017 11:23   Dg Foot 2 Views Right  Result Date: 11/16/2017 CLINICAL DATA:  Diabetic foot ulcers. EXAM: RIGHT FOOT - 2 VIEW COMPARISON:  None. FINDINGS: No acute fracture or dislocation. Osteolysis of the second distal phalanx. Prominent soft tissue swelling of the second toe with dorsal subcutaneous emphysema extending to the distal metatarsal. Mild osteoarthritis of the first MTP joint. IMPRESSION: 1. Soft tissue swelling and subcutaneous emphysema in the second toe with the extension to the distal metatarsal, consistent with necrotizing infection. Osteomyelitis of the second distal phalanx. Electronically Signed   By: Titus Dubin M.D.   On: 11/16/2017 11:26    Procedures Procedures (including critical care time)  Medications Ordered in ED Medications  dextrose 5 %-0.45 % sodium chloride infusion (has no administration in time range)  insulin regular (NOVOLIN R,HUMULIN R) 100 Units in sodium chloride 0.9 % 100 mL (1 Units/mL) infusion (15.9 Units/hr Intravenous Rate/Dose Change 11/16/17 1227)  sodium chloride 0.9 % bolus 1,000 mL (1,000 mLs Intravenous New Bag/Given 11/16/17 0909)    And  sodium chloride 0.9 % bolus 1,000 mL (0 mLs Intravenous Stopped 11/16/17 1124)    And  sodium chloride 0.9 % bolus 1,000 mL (0 mLs Intravenous Stopped 11/16/17 1207)    And  0.9 %  sodium chloride infusion ( Intravenous New Bag/Given 11/16/17 1229)  albuterol (PROVENTIL) (2.5 MG/3ML) 0.083% nebulizer solution 10 mg (10 mg  Nebulization Not Given 11/16/17 1035)  sodium bicarbonate 150 mEq in sterile water 1,000 mL infusion ( Intravenous New Bag/Given 11/16/17 1055)  vancomycin  (VANCOCIN) 2,500 mg in sodium chloride 0.9 % 500 mL IVPB (2,500 mg Intravenous New Bag/Given 11/16/17 1116)  piperacillin-tazobactam (ZOSYN) IVPB 3.375 g (3.375 g Intravenous New Bag/Given 11/16/17 1216)  piperacillin-tazobactam (ZOSYN) IVPB 3.375 g (has no administration in time range)  vancomycin (VANCOCIN) IVPB 1000 mg/200 mL premix (has no administration in time range)  0.9 %  sodium chloride infusion ( Intravenous New Bag/Given 11/16/17 1229)  0.9 %  sodium chloride infusion ( Intravenous New Bag/Given 11/16/17 1229)  dextrose 5 %-0.45 % sodium chloride infusion (has no administration in time range)  heparin injection 5,000 Units (has no administration in time range)  calcium gluconate 1 g in sodium chloride 0.9 % 100 mL IVPB (0 g Intravenous Stopped 11/16/17 1129)  sodium bicarbonate injection 50 mEq (50 mEq Intravenous Given 11/16/17 1040)  sodium chloride 0.9 % bolus 1,000 mL (0 mLs Intravenous Stopped 11/16/17 1227)     Initial Impression / Assessment and Plan / ED Course  I have reviewed the triage vital signs and the nursing notes.  Pertinent labs & imaging results that were available during my care of the patient were reviewed by me and considered in my medical decision making (see chart for details).     BP (!) 178/106   Pulse (!) 107   Temp (S) 100.1 F (37.8 C) (Rectal)   Resp (!) 33   Ht 5\' 10"  (1.778 m)   Wt 127 kg (280 lb)   SpO2 100%   BMI 40.18 kg/m    Final Clinical Impressions(s) / ED Diagnoses   Final diagnoses:  Gangrene of left foot (HCC)  Gangrene of right foot ([place])  Diabetic ketoacidosis with coma associated with diabetes mellitus due to underlying condition [facility])    ED Discharge Orders    None     9:13 AM Patient here for evaluation of generalized weakness.  He has symptoms concerning for DKA as he is exhibiting Kussmaul breathing, appears to be extremely dehydrated, initial CBG of 398. I am concerned for metabolic acidosis 2/2 DKA.  Work up  initiated, care discussed with Dr. Thomasene Lot.    9:33 AM CBGs greater than 600.  Lactic acid is 4.47.  Potassium is 6.1.  Evidence of AKI with creatinine of 2.57.  Sodium is 125.  White count of 28.1 EKG shows peak T waves.  I have initiated a glucose stabilizer, as well as a treat for hyperkalemia with insulin, sodium bicarb, IV fluid, albuterol, calcium gluconate.  10:22 AM Patient has a pH of 7.0 consistent with metabolic acidosis.  His anion gap is greater than 33.  He is unable to produce any urine at this time.  He is receiving aggressive fluid hydration as well as insulin and other medications to help stabilize his hyperkalemia.  Elevated lactic of 4.47, on exam, pt has significant gangrene toes involving both feet.  R foot with macerated, foul odor and gangrene 2nd toe, and on the L foot significant gangrene L great toe and 2 toes.  Will obtain portable xrays of the foot.  Broad spectrum abx initiated.    10:34 AM Significant metabolic acidosis with pH 7.1, CO2 11, low sodium bicarb, will need bicarb drips.  Request pharmacy to dose. Will consult admission to ICU.  Pt may need intubation.  11:43 AM Pt shows improvement after receiving current treatment.  May not need intubation.  Will repeat vbg and istat chem8.  ICU fellow currently evaluating pt.   11:50 AM Pt will be admitted to ICU under the care of intensivist.  I will also consult oncall orthopedist in regard to pt's gangrene feet.  12:31 PM Appreciate consultation from orthopedist PA, Legrand Como who agrees to see and evaluate pt.    BP (!) 178/106   Pulse (!) 107   Temp (S) 100.1 F (37.8 C) (Rectal)   Resp (!) 33   Ht 5\' 10"  (1.778 m)   Wt 127 kg (280 lb)   SpO2 100%   BMI 40.18 kg/m   CRITICAL CARE Performed by: Domenic Moras Total critical care time: 70 minutes Critical care time was exclusive of separately billable procedures and treating other patients. Critical care was necessary to treat or prevent imminent or  life-threatening deterioration. Critical care was time spent personally by me on the following activities: development of treatment plan with patient and/or surrogate as well as nursing, discussions with consultants, evaluation of patient's response to treatment, examination of patient, obtaining history from patient or surrogate, ordering and performing treatments and interventions, ordering and review of laboratory studies, ordering and review of radiographic studies, pulse oximetry and re-evaluation of patient's condition.    Domenic Moras, PA-C 11/16/17 1235    [place], Fredia Sorrow, MD 11/22/17 1514

## 2017-11-17 ENCOUNTER — Inpatient Hospital Stay (HOSPITAL_COMMUNITY): Payer: Medicaid Other

## 2017-11-17 DIAGNOSIS — E1110 Type 2 diabetes mellitus with ketoacidosis without coma: Principal | ICD-10-CM

## 2017-11-17 LAB — BASIC METABOLIC PANEL
ANION GAP: 12 (ref 5–15)
Anion gap: 13 (ref 5–15)
Anion gap: 10 (ref 5–15)
BUN: 32 mg/dL — ABNORMAL HIGH (ref 6–20)
BUN: 28 mg/dL — AB (ref 6–20)
BUN: 23 mg/dL — ABNORMAL HIGH (ref 6–20)
CALCIUM: 9.4 mg/dL (ref 8.9–10.3)
CHLORIDE: 104 mmol/L (ref 101–111)
CO2: 19 mmol/L — ABNORMAL LOW (ref 22–32)
CO2: 18 mmol/L — ABNORMAL LOW (ref 22–32)
CO2: 20 mmol/L — ABNORMAL LOW (ref 22–32)
CREATININE: 0.89 mg/dL (ref 0.61–1.24)
Calcium: 9.4 mg/dL (ref 8.9–10.3)
Calcium: 9.3 mg/dL (ref 8.9–10.3)
Chloride: 108 mmol/L (ref 101–111)
Chloride: 106 mmol/L (ref 101–111)
Creatinine, Ser: 1.01 mg/dL (ref 0.61–1.24)
Creatinine, Ser: 0.96 mg/dL (ref 0.61–1.24)
GFR calc Af Amer: >60 mL/min (ref >60)
GLUCOSE: 112 mg/dL — AB (ref 65–99)
Glucose, Bld: 166 mg/dL — ABNORMAL HIGH (ref 65–99)
Glucose, Bld: 98 mg/dL (ref 65–99)
POTASSIUM: 3.7 mmol/L (ref 3.5–5.1)
POTASSIUM: 3.9 mmol/L (ref 3.5–5.1)
Potassium: 3.4 mmol/L — ABNORMAL LOW (ref 3.5–5.1)
SODIUM: 136 mmol/L (ref 135–145)
SODIUM: 138 mmol/L (ref 135–145)
SODIUM: 136 mmol/L (ref 135–145)

## 2017-11-17 LAB — GLUCOSE, CAPILLARY
GLUCOSE-CAPILLARY: 178 mg/dL — AB (ref 65–99)
GLUCOSE-CAPILLARY: 183 mg/dL — AB (ref 65–99)
GLUCOSE-CAPILLARY: 228 mg/dL — AB (ref 65–99)
GLUCOSE-CAPILLARY: 342 mg/dL — AB (ref 65–99)
GLUCOSE-CAPILLARY: 96 mg/dL (ref 65–99)
Glucose-Capillary: 145 mg/dL — ABNORMAL HIGH (ref 65–99)
Glucose-Capillary: 169 mg/dL — ABNORMAL HIGH (ref 65–99)
Glucose-Capillary: 176 mg/dL — ABNORMAL HIGH (ref 65–99)
Glucose-Capillary: 190 mg/dL — ABNORMAL HIGH (ref 65–99)
Glucose-Capillary: 196 mg/dL — ABNORMAL HIGH (ref 65–99)
Glucose-Capillary: 185 mg/dL — ABNORMAL HIGH (ref 65–99)
Glucose-Capillary: 314 mg/dL — ABNORMAL HIGH (ref 65–99)
Glucose-Capillary: 362 mg/dL — ABNORMAL HIGH (ref 65–99)

## 2017-11-17 LAB — HIV ANTIBODY (ROUTINE TESTING W REFLEX): HIV Screen 4th Generation wRfx: NONREACTIVE

## 2017-11-17 LAB — PHOSPHORUS: PHOSPHORUS: 2.1 mg/dL — AB (ref 2.5–4.6)

## 2017-11-17 LAB — HEMOGLOBIN A1C
Hgb A1c MFr Bld: 12.2 % — ABNORMAL HIGH (ref 4.8–5.6)
Mean Plasma Glucose: 303.44 mg/dL

## 2017-11-17 MED ORDER — SODIUM CHLORIDE 0.9 % IV BOLUS: 1000.0000 mL | Freq: Once | INTRAVENOUS | Status: DC

## 2017-11-17 MED ORDER — VANCOMYCIN HCL IN DEXTROSE 1-5 GM/200ML-% IV SOLN
1000.0000 mg | Freq: Three times a day (TID) | INTRAVENOUS | Status: DC
  Administered 2017-11-17 – 2017-11-18 (×4): 1000 mg via INTRAVENOUS
  Filled 2017-11-17 (×5): qty 200

## 2017-11-17 MED ORDER — SODIUM CHLORIDE 0.9 % IV SOLN: 1.0000 g | Freq: Once | INTRAVENOUS | Status: DC

## 2017-11-17 MED ORDER — POVIDONE-IODINE 10 % EX SWAB: 2.0000 "application " | Freq: Once | CUTANEOUS | Status: DC

## 2017-11-17 MED ORDER — INSULIN GLARGINE 100 UNIT/ML ~~LOC~~ SOLN
25.0000 [IU] | SUBCUTANEOUS | Status: DC
  Administered 2017-11-17: 25 [IU] via SUBCUTANEOUS
  Filled 2017-11-17: qty 0.25

## 2017-11-17 MED ORDER — CHLORPROMAZINE HCL 10 MG PO TABS
10.0000 mg | ORAL_TABLET | Freq: Three times a day (TID) | ORAL | Status: DC | PRN
  Administered 2017-11-17: 10 mg via ORAL
  Filled 2017-11-17 (×2): qty 1

## 2017-11-17 MED ORDER — SODIUM BICARBONATE 8.4 % IV SOLN: 50.0000 meq | Freq: Once | INTRAVENOUS | Status: DC

## 2017-11-17 MED ORDER — POTASSIUM PHOSPHATES 15 MMOLE/5ML IV SOLN: 30.0000 mmol | Freq: Once | INTRAVENOUS | Status: DC

## 2017-11-17 MED ORDER — INSULIN GLARGINE 100 UNIT/ML ~~LOC~~ SOLN
24.0000 [IU] | SUBCUTANEOUS | Status: DC
  Administered 2017-11-17: 24 [IU] via SUBCUTANEOUS
  Filled 2017-11-17 (×2): qty 0.24

## 2017-11-17 MED ORDER — VANCOMYCIN HCL 10 G IV SOLR: 2500.0000 mg | Freq: Once | INTRAVENOUS | Status: DC

## 2017-11-17 MED ORDER — GADOBENATE DIMEGLUMINE 529 MG/ML IV SOLN
20.0000 mL | Freq: Once | INTRAVENOUS | Status: AC | PRN
  Administered 2017-11-17: 20 mL via INTRAVENOUS

## 2017-11-17 MED ORDER — LORAZEPAM 2 MG/ML IJ SOLN: 1.0000 mg | Freq: Once | INTRAMUSCULAR | Status: DC

## 2017-11-17 MED ORDER — INSULIN ASPART 100 UNIT/ML ~~LOC~~ SOLN
0.0000 [IU] | SUBCUTANEOUS | Status: DC
  Administered 2017-11-17: 2 [IU] via SUBCUTANEOUS
  Administered 2017-11-17 (×2): 9 [IU] via SUBCUTANEOUS
  Administered 2017-11-17 – 2017-11-18 (×3): 7 [IU] via SUBCUTANEOUS
  Administered 2017-11-18: 9 [IU] via SUBCUTANEOUS

## 2017-11-17 MED ORDER — CHLORHEXIDINE GLUCONATE 4 % EX LIQD
60.0000 mL | Freq: Once | CUTANEOUS | Status: AC
  Administered 2017-11-18: 4 via TOPICAL
  Filled 2017-11-17 (×2): qty 60

## 2017-11-17 MED ORDER — ONDANSETRON HCL 4 MG/2ML IJ SOLN
4.0000 mg | Freq: Four times a day (QID) | INTRAMUSCULAR | Status: DC | PRN
  Administered 2017-11-17: 4 mg via INTRAVENOUS
  Filled 2017-11-17 (×2): qty 2

## 2017-11-17 MED ORDER — VANCOMYCIN HCL IN DEXTROSE 1-5 GM/200ML-% IV SOLN
1000.0000 mg | Freq: Three times a day (TID) | INTRAVENOUS | Status: DC
  Filled 2017-11-17: qty 200

## 2017-11-17 MED ORDER — GLUCERNA SHAKE PO LIQD
237.0000 mL | Freq: Two times a day (BID) | ORAL | Status: DC
  Administered 2017-11-17 – 2017-11-27 (×19): 237 mL via ORAL
  Filled 2017-11-17: qty 237

## 2017-11-17 MED ORDER — POTASSIUM CHLORIDE 10 MEQ/100ML IV SOLN
10.0000 meq | INTRAVENOUS | Status: AC
  Administered 2017-11-17 (×3): 10 meq via INTRAVENOUS
  Filled 2017-11-17 (×3): qty 100

## 2017-11-17 MED ORDER — LORAZEPAM 2 MG/ML IJ SOLN
1.0000 mg | Freq: Once | INTRAMUSCULAR | Status: DC | PRN
  Filled 2017-11-17 (×2): qty 1

## 2017-11-17 MED ORDER — LORAZEPAM 2 MG/ML IJ SOLN: 1.0000 mg | Freq: Once | INTRAMUSCULAR | Status: DC | PRN

## 2017-11-17 NOTE — H&P (View-Only) (Signed)
ORTHOPAEDIC CONSULTATION  REQUESTING PHYSICIAN: Cherene Altes, MD  Chief Complaint: Ulceration abscess necrosis with foul-smelling drainage above the  HPI: Ian Bond is a 42 y.o. male who presents with ulceration and necrotic tissue both feet.  Patient states that he was an undiagnosed diabetic.  Patient states that the ulcers have been there for a prolonged period of time.  Past Medical History:  Diagnosis Date  . Diabetes mellitus without complication ([place])    History reviewed. No pertinent surgical history. Social History   Socioeconomic History  . Marital status: Single    Spouse name: Not on file  . Number of children: Not on file  . Years of education: Not on file  . Highest education level: Not on file  Occupational History  . Not on file  Social Needs  . Financial resource strain: Not on file  . Food insecurity:    Worry: Not on file    Inability: Not on file  . Transportation needs:    Medical: Not on file    Non-medical: Not on file  Tobacco Use  . Smoking status: Never Smoker  . Smokeless tobacco: Never Used  Substance and Sexual Activity  . Alcohol use: Not Currently  . Drug use: Never  . Sexual activity: Yes  Lifestyle  . Physical activity:    Days per week: Not on file    Minutes per session: Not on file  . Stress: Not on file  Relationships  . Social connections:    Talks on phone: Not on file    Gets together: Not on file    Attends religious service: Not on file    Active member of club or organization: Not on file    Attends meetings of clubs or organizations: Not on file    Relationship status: Not on file  Other Topics Concern  . Not on file  Social History Narrative  . Not on file   Family History  Problem Relation Age of Onset  . Diabetes Maternal Uncle    - negative except otherwise stated in the family history section No Known Allergies Prior to Admission medications   Medication Sig Start Date End Date Taking?  Authorizing Provider  bismuth subsalicylate (PEPTO BISMOL) 262 MG/15ML suspension Take 30 mLs by mouth as needed for indigestion or diarrhea or loose stools.   Yes [provider]  diphenhydrAMINE (BENADRYL) 25 mg capsule Take 25 mg by mouth daily.   Yes [provider]   Mr Foot Right W Contrast  Result Date: 11/17/2017 CLINICAL DATA:  Diabetic foot ulcers with right second toe infection. EXAM: MRI OF THE RIGHT FOREFOOT WITH CONTRAST TECHNIQUE: Multiplanar, multisequence MR imaging of the right forefoot was performed following the administration of intravenous contrast. CONTRAST:  2mL MULTIHANCE GADOBENATE DIMEGLUMINE 529 MG/ML IV SOLN COMPARISON:  Right foot x-rays from yesterday. FINDINGS: Bones/Joint/Cartilage Diffusely decreased T1 marrow signal without edema or enhancement of the second middle and distal phalanges, consistent with osteonecrosis. Osseous destruction of the second distal phalanx. Mild marrow edema within the second proximal phalanx with preserved T1 marrow signal. No fracture or dislocation. Normal alignment. Trace second and third MTP joint effusions. 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 Ligaments Collateral ligaments are intact. Muscles and Tendons Flexor, peroneal and extensor compartment tendons are intact. Increased T2 signal within the intrinsic muscles of the forefoot, nonspecific, but likely related to diabetic muscle changes. Soft tissue Diffuse skin thickening and soft tissue swelling about the second toe with  subcutaneous emphysema, which extends dorsally to the mid second metatarsal. No fluid collection. IMPRESSION: 1. 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 Subcutaneous emphysema extends dorsally to the level of the mid second metatarsal. Electronically Signed   By: Titus Dubin M.D.   On: 11/17/2017 12:14   Mr Foot Left W Contrast  Result Date: 11/17/2017 CLINICAL DATA:   Nonhealing diabetic ulcers with left great toe infection. EXAM: MRI OF THE LEFT FOREFOOT WITH CONTRAST TECHNIQUE: Multiplanar, multisequence MR imaging of the left forefoot was performed following the administration of intravenous contrast. CONTRAST:  69mL MULTIHANCE GADOBENATE DIMEGLUMINE 529 MG/ML IV SOLN COMPARISON:  Left foot x-rays from yesterday. FINDINGS: Bones/Joint/Cartilage Diffusely decreased marrow signal within the first proximal and distal phalanges without edema or enhancement, consistent with osteonecrosis. Marrow edema within the second distal phalanx with preserved T1 marrow signal. Moderate osteoarthritis of the first MTP joint. Moderate first MTP joint effusion. No acute fracture or dislocation. Muscles and Tendons Increased T2 signal within the intrinsic muscles of the forefoot, nonspecific, but likely related to diabetic muscle changes. No evidence of tenosynovitis. Soft tissues Diffuse skin thickening and soft tissue edema about the dorsal forefoot. Prominent inflammatory changes with subcutaneous emphysema about the great toe with nonenhancement of the soft tissue, consistent with gangrene. Subcutaneous emphysema extends dorsally to the mid first metatarsal. Small ulceration along the tip of the second toe. Blistering of the dorsal skin along the distal forefoot. IMPRESSION: 1. Gangrenous appearance of the great toe with osteonecrosis of the first proximal and distal phalanges. Subcutaneous emphysema extends dorsally to the mid first metatarsal. 2. Moderate first MTP joint effusion. Septic arthritis is not excluded. 3. Small ulceration at the tip of the second toe with underlying marrow edema with normal T1 signal in the second distal phalanx, likely reactive. Electronically Signed   By: Titus Dubin M.D.   On: 11/17/2017 12:04   Dg Chest Port 1 View  Result Date: 11/16/2017 CLINICAL DATA:  Weakness and generalized pain EXAM: PORTABLE CHEST 1 VIEW COMPARISON:  None. FINDINGS: Lungs are  clear. Heart size and pulmonary vascularity are normal. No adenopathy. No bone lesions. IMPRESSION: No edema or consolidation. Electronically Signed   By: Lowella Grip III M.D.   On: 11/16/2017 11:22   Dg Foot 2 Views Left  Result Date: 11/16/2017 CLINICAL DATA:  Ulcers EXAM: LEFT FOOT - 2 VIEW COMPARISON:  None. FINDINGS: Moderate to advanced degenerative changes at the 1st MTP joint and IP joint. Soft tissue gas noted within the great toe soft tissues and extending along the dorsum of the foot. No definite bony changes of osteomyelitis. No fracture, subluxation or dislocation. IMPRESSION: Extensive soft tissue gas in the left great toe and along the dorsum of the foot. No definite changes of osteomyelitis noted. If there is high clinical suspicion for osteomyelitis, MRI would be more sensitive. Electronically Signed   By: Rolm Baptise M.D.   On: 11/16/2017 11:23   Dg Foot 2 Views Right  Result Date: 11/16/2017 CLINICAL DATA:  Diabetic foot ulcers. EXAM: RIGHT FOOT - 2 VIEW COMPARISON:  None. FINDINGS: No acute fracture or dislocation. Osteolysis of the second distal phalanx. Prominent soft tissue swelling of the second toe with dorsal subcutaneous emphysema extending to the distal metatarsal. Mild osteoarthritis of the first MTP joint. IMPRESSION: 1. Soft tissue swelling and subcutaneous emphysema in the second toe with the extension to the distal metatarsal, consistent with necrotizing infection. Osteomyelitis of the second distal phalanx.  Electronically Signed   By: Titus Dubin M.D.   On: 11/16/2017 11:26   - pertinent xrays, CT, MRI studies were reviewed and independently interpreted  Positive ROS: All other systems have been reviewed and were otherwise negative with the exception of those mentioned in the HPI and as above.  Physical Exam: General: Alert, no acute distress Psychiatric: Patient is competent for consent with normal mood and affect Lymphatic: No axillary or cervical  lymphadenopathy Cardiovascular: No pedal edema Respiratory: No cyanosis, no use of accessory musculature GI: No organomegaly, abdomen is soft and non-tender    Images:  @ENCIMAGES @  Labs:  Lab Results  Component Value Date   HGBA1C 12.2 (H) 11/17/2017   REPTSTATUS PENDING 11/16/2017   CULT  11/16/2017    NO GROWTH < 24 HOURS Performed at [facility] [address], [place], [place] [address]      Neurologic: Patient does not have protective sensation bilateral lower extremities.   MUSCULOSKELETAL:   Skin: Examination of the skin there is swelling necrotic drainage black skin color changes of the forefoot bilaterally.  Patient has a good dorsalis pedis and posterior tibial pulse in both feet.  There is no crepitation extending up the leg no evidence of necrotizing fasciitis.  There are venous stasis changes in both legs but no ulcers.  Review of the MRI scan shows cellulitis and abscess involving the forefoot and cellulitis extending into the midfoot.  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  Plan: Plan: Discussed with the patient we could attempt foot salvage intervention.  Discussed with the patient being a large individual he most likely would do better with a transtibial amputation and there is a high likelihood that the abscess and infection extends into the hindfoot.  We will plan for surgical transmetatarsal amputation tomorrow and if necessary would proceed with further amputation surgery next week.  Risks and benefits were discussed including sepsis and death.  Patient states he understands wished to proceed with surgery.  Thank you for the consult and the opportunity to see Mr. Asahel Risden, [place] [phone] 1:16 PM

## 2017-11-17 NOTE — Progress Notes (Signed)
TEAM 1 - Stepdown/ICU TEAM  Ian Bond  AJO:[id number] DOB: 03-26-1976 DOA: 11/16/2017 PCP: Patient, No Pcp Per    Brief Narrative:  42yo obese M who has not sought medical care in years, and was admitted with nausea/vomiting and shortness of breath.  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  Exam revealed foul-smelling necrotic toes on both feet  Significant Events: 4/25 admit - DKA - diabetic foot wounds  Subjective: Resting comfortably in bed.  C/o thirst.  Denies cp, sob, n/v, or abdom pain.    Assessment & Plan:  DKA - Uncontrolled Newly diagnosed DM2 A1c 12.2 - DKA resolved - CBG currently well controlled - follow trend using SSI at this time - begin DM education when pt ready to receive it (currently too anxious about his feet to pay attention)   B Diabetic foot wounds - gangrene of toes  Ortho following - MRI B feet pending for today - anticipate amputations will be required   Mild hypokalemia  Supplement and follow - due to correction of DKA/total body deficit  Acute kidney injury Resolved   Etoh abuse  3-4 beers per day - no evidence of withdrawal thus far  Obesity - Body mass index is 32.52 kg/m.   DVT prophylaxis: SQ heparin  Code Status: FULL CODE Family Communication: no family present at time of exam  Disposition Plan:   Consultants:  PCCM Ortopedics   Antimicrobials:  Vanc 4/25  Zosyn 4/25 >  Objective: Blood pressure 129/85, pulse 82, temperature 98.2 F (36.8 C), temperature source Oral, resp. rate (!) 22, height 6\' 3"  (1.905 m), weight 118 kg (260 lb 2.3 oz), SpO2 99 %.  Intake/Output Summary (Last 24 hours) at 11/17/2017 0826 Last data filed at 11/17/2017 0800 Gross per 24 hour  Intake 7828.74 ml  Output 2910 ml  Net 4918.74 ml   Filed Weights   11/16/17 0946 11/16/17 1343  Weight: 127 kg (280 lb) 118 kg (260 lb 2.3 oz)    Examination: General: No acute respiratory distress Lungs: Clear to  auscultation bilaterally without wheezes or crackles Cardiovascular: Regular rate and rhythm without murmur gallop or rub normal S1 and S2 Abdomen: Nontender, nondistended, soft, bowel sounds positive, no rebound, no ascites, no appreciable mass Extremities: trace edema B LE   CBC: Recent Labs  Lab 11/16/17 0825 11/16/17 1045 11/16/17 1215 11/17/17 0313  WBC 28.1*  --   --  18.9*  HGB 14.3 16.7 15.3 11.8*  HCT 41.7 49.0 45.0 33.3*  MCV 87.4  --   --  81.2  PLT 642*  --   --  947*   Basic Metabolic Panel: Recent Labs  Lab 11/16/17 1221 11/16/17 1529  11/17/17 0105 11/17/17 0313 11/17/17 0703  NA 131* 133*   < > 136 138 136  K 4.9 4.3   < > 3.7 3.9 3.4*  CL 95* 103   < > 104 108 106  CO2 7* 14*   < > 19* 18* 20*  GLUCOSE 585* 297*   < > 166* 112* 98  BUN 49* 43*   < > 32* 28* 23*  CREATININE 2.35* 1.73*   < > 1.01 0.96 0.89  CALCIUM 9.1 8.9   < > 9.4 9.3 9.4  PHOS 4.2 1.6*  --   --  2.1*  --    < > = values in this interval not displayed.   GFR: Estimated Creatinine Clearance: 148.2 mL/min (by C-G formula based  on SCr of 0.89 mg/dL).  Liver Function Tests: Recent Labs  Lab 11/16/17 0825  AST 23  ALT 18  ALKPHOS 171*  BILITOT 1.9*  PROT 9.2*  ALBUMIN 2.6*   Recent Labs  Lab 11/16/17 0825  LIPASE 77*      HbA1C: Hgb A1c MFr Bld  Date/Time Value Ref Range Status  11/17/2017 03:13 AM 12.2 (H) 4.8 - 5.6 % Final    Comment:    (NOTE) Pre diabetes:          5.7%-6.4% Diabetes:              >6.4% Glycemic control for   <7.0% adults with diabetes     CBG: Recent Labs  Lab 11/16/17 2337 11/17/17 0030 11/17/17 0131 11/17/17 0236 11/17/17 0331  GLUCAP 176* 183* 178* 145* 96    Recent Results (from the past 240 hour(s))  MRSA PCR Screening     Status: None   Collection Time: 11/16/17  1:51 PM  Result Value Ref Range Status   MRSA by PCR NEGATIVE NEGATIVE Final    Comment:        The GeneXpert MRSA Assay (FDA approved for NASAL specimens only),  is one component of a comprehensive MRSA colonization surveillance program. It is not intended to diagnose MRSA infection nor to guide or monitor treatment for MRSA infections. Performed at [facility], [place] [address], [place], [place] [address]      Scheduled Meds: . albuterol  10 mg Nebulization Once  . chlorhexidine  15 mL Mouth Rinse BID  . heparin  5,000 Units Subcutaneous Q8H  . insulin aspart  0-9 Units Subcutaneous Q4H  . insulin glargine  25 Units Subcutaneous Q24H  . mouth rinse  15 mL Mouth Rinse q12n4p  . metoprolol tartrate  5 mg Intravenous Q8H  . pneumococcal 23 valent vaccine  0.5 mL Intramuscular Tomorrow-1000     LOS: 1 day   Cherene Altes, MD [facility] Office  [phone] Pager - Text Page per Shea Evans as per below:  On-Call/Text Page:      [email or web address]      password [email or web address]  If 7PM-7AM, please contact night-coverage [email or web address] Password [facility] 11/17/2017, 8:26 AM

## 2017-11-17 NOTE — Progress Notes (Signed)
   11/17/17 1000  Clinical Encounter Type  Visited With Patient not available  Visit Type Initial  Referral From Nurse  Consult/Referral To Chaplain  Spiritual Encounters  Spiritual Needs Prayer;Emotional    Neither Pt nor direct care nurse were available. Chaplain talked to one unit nurse who was aware of the Pt and said Pt had been taken for an MRI. chaplain will revisit before the end of the day.  Adreyan Carbajal a Medical sales representative, [employer]

## 2017-11-17 NOTE — Progress Notes (Addendum)
   Results for SOCRATES, CAHOON (MRN [id number]) as of 11/17/2017 13:23  Ref. Range 11/17/2017 01:31 11/17/2017 02:36 11/17/2017 03:31 11/17/2017 08:20 11/17/2017 11:55  Glucose-Capillary Latest Ref Range: 65 - 99 mg/dL 178 (H) 145 (H) 96 185 (H) 314 (H)  Noted that noon blood sugar was up to over 300 mg/dl.  Recommend changing Novolog to MODERATE correction scale TID & HS.  If blood sugars continue to be greater than 180 mg/dl, recommend adding Novolog 4 units TID as meal coverage if patient eats at least 50% of meals. Will continue to follow while in the hospital and will follow up when patient is more stable.   Patient does not have health insurance, so cheaper forms of diabetes medications will need to be considered at discharge. Patient will need a PCP to follow after discharge.    Harvel Ricks RN BSN CDE Diabetes Coordinator Pager: [phone]  8am-5pm

## 2017-11-17 NOTE — Progress Notes (Signed)
Initial Nutrition Assessment  DOCUMENTATION CODES:   Obesity unspecified  INTERVENTION:   - Glucerna Shake po BID, each supplement provides 220 kcal and 10 grams of protein  NUTRITION DIAGNOSIS:   Increased nutrient needs related to wound healing as evidenced by estimated needs.  GOAL:   Patient will meet greater than or equal to 90% of their needs  MONITOR:   PO intake, Supplement acceptance, Labs, I & O's, Skin, Weight trends  REASON FOR ASSESSMENT:   Malnutrition Screening Tool    ASSESSMENT:   42 year old male brought to ED by EMA for evaluation of generalized weakness and was found to be in DKA with infections on both feet. Pt has not followed up regularly with a physician for 20 years. PMH significant for diabetes mellitus, EtOH abuse, and newly diagnosed hypertension.  Spoke with pt's fiancee in room when pt was in MRI then again when pt returned. Pt states that the last time he ate solid food was when he ate a chicken patty and some noodles on Wednesday. Pt states that he has been drinking fluids like applesauce, apple juice, and orange juice even when he was not eating.  Pt reports his UBW as "in the 300's" and that he last weighed this "several months ago." Pt unable to provide more details. Pt endorses weight loss and was surprised to find out that his weight was 260 lbs. RD obtained re-weight at time of visit with items on the bed. Re-weight was 121.5 kg. Pt's fiancee reports that she has noticed pt's clothes fitting more loosely. No weight history in chart so unable to confirm weight loss.  Per Dr. Sharol Given, plan for surgical transmetatarsal amputation tomorrow.  UOP: 2910 x 24 hours I/O's: +4.3 L since admission  Medications reviewed and include: sliding scale Novolog, 24 units Lantus, IV antibiotics, 10 mEq KCl every x 3 today  Labs reviewed: potassium 3.4 (L), CO2 20 (L), BUN 23 (H), hemoglobin 11.8 (L), HCT 33.3 (L), hemoglobin A1C 12.2 (H) CBG's: 185, 96, 145,  178, 183, 176, 169, 196, 190 x 12 hours  NUTRITION - FOCUSED PHYSICAL EXAM:    Most Recent Value  Orbital Region  Mild depletion  Upper Arm Region  No depletion  Thoracic and Lumbar Region  No depletion  Buccal Region  Mild depletion  Temple Region  Mild depletion  Clavicle Bone Region  No depletion  Clavicle and Acromion Bone Region  No depletion  Scapular Bone Region  Unable to assess  Dorsal Hand  No depletion  Patellar Region  Mild depletion  Anterior Thigh Region  No depletion  Posterior Calf Region  Moderate depletion  Edema (RD Assessment)  Mild  Hair  Reviewed  Eyes  Reviewed  Mouth  Reviewed  Skin  Reviewed  Nails  Reviewed       Diet Order:  Diet heart healthy/carb modified Room service appropriate? Yes; Fluid consistency: Thin  EDUCATION NEEDS:   No education needs have been identified at this time  Skin:  Skin Assessment: Skin Integrity Issues: Skin Integrity Issues:: Diabetic Ulcer Diabetic Ulcer: bilateral feet/toes  Last BM:  11/16/17  Height:   Ht Readings from Last 1 Encounters:  11/16/17 6\' 3"  (1.905 m)    Weight:   Wt Readings from Last 1 Encounters:  11/16/17 260 lb 2.3 oz (118 kg)    Ideal Body Weight:  89.1 kg  BMI:  Body mass index is 32.52 kg/m.  Estimated Nutritional Needs:   Kcal:  2400-2600 kcal/day  Protein:  125-140  grams/day  Fluid:  >/= 2.4 L/day    Gaynell Face, MS, RD, LDN Pager: [phone] Weekend/After Hours: [phone]

## 2017-11-17 NOTE — Progress Notes (Signed)
Pharmacy Antibiotic Note  Ian Bond is a 42 y.o. 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  Pharmacy has been consulted for Vancomycin/Zosyn dosing.  Assessment:  Patient's WBC is improving and he has no fever. MRSA PCR neg, however MRI consistent with osteonecrosis, recommended to continue vancomycin empirically. Renal function is improved, will adjust antibiotic dosing.   Plan: Vancomycin 1000mg  IV q8h  Zosyn 3.375g IV q8h (4h inf) Monitor clinical progress, blood cultures, renal function, s/sx of infection, and LOT.  Height: 6\' 3"  (190.5 cm) Weight: 260 lb 2.3 oz (118 kg) IBW/kg (Calculated) : 84.5  Temp (24hrs), Avg:98 F (36.7 C), Min:97.4 F (36.3 C), Max:98.3 F (36.8 C)  Recent Labs  Lab 11/16/17 0825 11/16/17 0905 11/16/17 1029  11/16/17 1358 11/16/17 1529 11/16/17 2135 11/17/17 0105 11/17/17 0313 11/17/17 0703  WBC 28.1*  --   --   --   --   --   --   --  18.9*  --   CREATININE 2.57*  --   --    < >  --  1.73* 1.21 1.01 0.96 0.89  LATICACIDVEN  --  4.47* 3.98*  --  2.4* 1.9  --   --   --   --    < > = values in this interval not displayed.    Estimated Creatinine Clearance: 148.2 mL/min (by C-G formula based on SCr of 0.89 mg/dL).    No Known Allergies  Antimicrobials this admission: 4/25 Vanc >>  4/25 Zosyn >>   Dose adjustments this admission: Vanc 1000mg   q12h > q8h (improved renal function)  Microbiology results: 4/25 BCx: NGTD 4/25 MRSA PCR: neg  Thank you for allowing pharmacy to be a part of this patient's care.  Levonne Lapping, PharmD Candidate 11/17/2017 12:06 PM

## 2017-11-17 NOTE — Progress Notes (Signed)
   11/17/17 1600  Clinical Encounter Type  Visited With Patient;Patient and family together  Visit Type Follow-up  Referral From Nurse  Consult/Referral To Chaplain  Spiritual Encounters  Spiritual Needs Emotional  Stress Factors  Patient Stress Factors Exhausted  Family Stress Factors Exhausted   visited the third time and Pt was in this time. Pt very exhausted and sad due to his long day out for MRI and other procedures. AD paperwork ready but volunteers not available. Direct care nurse agreed to notify spiritual care department when Volunteers and notary are available first thing on Monday. Chaplain provided brochure instructions and compassionate presence.  Ian Bond a Medical sales representative, [employer]

## 2017-11-17 NOTE — Consult Note (Signed)
ORTHOPAEDIC CONSULTATION  REQUESTING PHYSICIAN: Cherene Altes, MD  Chief Complaint: Ulceration abscess necrosis with foul-smelling drainage above the  HPI: Ian Bond is a 42 y.o. male who presents with ulceration and necrotic tissue both feet.  Patient states that he was an undiagnosed diabetic.  Patient states that the ulcers have been there for a prolonged period of time.  Past Medical History:  Diagnosis Date  . Diabetes mellitus without complication ([place])    History reviewed. No pertinent surgical history. Social History   Socioeconomic History  . Marital status: Single    Spouse name: Not on file  . Number of children: Not on file  . Years of education: Not on file  . Highest education level: Not on file  Occupational History  . Not on file  Social Needs  . Financial resource strain: Not on file  . Food insecurity:    Worry: Not on file    Inability: Not on file  . Transportation needs:    Medical: Not on file    Non-medical: Not on file  Tobacco Use  . Smoking status: Never Smoker  . Smokeless tobacco: Never Used  Substance and Sexual Activity  . Alcohol use: Not Currently  . Drug use: Never  . Sexual activity: Yes  Lifestyle  . Physical activity:    Days per week: Not on file    Minutes per session: Not on file  . Stress: Not on file  Relationships  . Social connections:    Talks on phone: Not on file    Gets together: Not on file    Attends religious service: Not on file    Active member of club or organization: Not on file    Attends meetings of clubs or organizations: Not on file    Relationship status: Not on file  Other Topics Concern  . Not on file  Social History Narrative  . Not on file   Family History  Problem Relation Age of Onset  . Diabetes Maternal Uncle    - negative except otherwise stated in the family history section No Known Allergies Prior to Admission medications   Medication Sig Start Date End Date Taking?  Authorizing Provider  bismuth subsalicylate (PEPTO BISMOL) 262 MG/15ML suspension Take 30 mLs by mouth as needed for indigestion or diarrhea or loose stools.   Yes [provider]  diphenhydrAMINE (BENADRYL) 25 mg capsule Take 25 mg by mouth daily.   Yes [provider]   Ian Foot Right W Contrast  Result Date: 11/17/2017 CLINICAL DATA:  Diabetic foot ulcers with right second toe infection. EXAM: MRI OF THE RIGHT FOREFOOT WITH CONTRAST TECHNIQUE: Multiplanar, multisequence Ian imaging of the right forefoot was performed following the administration of intravenous contrast. CONTRAST:  35mL MULTIHANCE GADOBENATE DIMEGLUMINE 529 MG/ML IV SOLN COMPARISON:  Right foot x-rays from yesterday. FINDINGS: Bones/Joint/Cartilage Diffusely decreased T1 marrow signal without edema or enhancement of the second middle and distal phalanges, consistent with osteonecrosis. Osseous destruction of the second distal phalanx. Mild marrow edema within the second proximal phalanx with preserved T1 marrow signal. No fracture or dislocation. Normal alignment. Trace second and third MTP joint effusions. 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 Ligaments Collateral ligaments are intact. Muscles and Tendons Flexor, peroneal and extensor compartment tendons are intact. Increased T2 signal within the intrinsic muscles of the forefoot, nonspecific, but likely related to diabetic muscle changes. Soft tissue Diffuse skin thickening and soft tissue swelling about the second toe with  subcutaneous emphysema, which extends dorsally to the mid second metatarsal. No fluid collection. IMPRESSION: 1. 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 Subcutaneous emphysema extends dorsally to the level of the mid second metatarsal. Electronically Signed   By: Titus Dubin M.D.   On: 11/17/2017 12:14   Ian Foot Left W Contrast  Result Date: 11/17/2017 CLINICAL DATA:   Nonhealing diabetic ulcers with left great toe infection. EXAM: MRI OF THE LEFT FOREFOOT WITH CONTRAST TECHNIQUE: Multiplanar, multisequence Ian imaging of the left forefoot was performed following the administration of intravenous contrast. CONTRAST:  46mL MULTIHANCE GADOBENATE DIMEGLUMINE 529 MG/ML IV SOLN COMPARISON:  Left foot x-rays from yesterday. FINDINGS: Bones/Joint/Cartilage Diffusely decreased marrow signal within the first proximal and distal phalanges without edema or enhancement, consistent with osteonecrosis. Marrow edema within the second distal phalanx with preserved T1 marrow signal. Moderate osteoarthritis of the first MTP joint. Moderate first MTP joint effusion. No acute fracture or dislocation. Muscles and Tendons Increased T2 signal within the intrinsic muscles of the forefoot, nonspecific, but likely related to diabetic muscle changes. No evidence of tenosynovitis. Soft tissues Diffuse skin thickening and soft tissue edema about the dorsal forefoot. Prominent inflammatory changes with subcutaneous emphysema about the great toe with nonenhancement of the soft tissue, consistent with gangrene. Subcutaneous emphysema extends dorsally to the mid first metatarsal. Small ulceration along the tip of the second toe. Blistering of the dorsal skin along the distal forefoot. IMPRESSION: 1. Gangrenous appearance of the great toe with osteonecrosis of the first proximal and distal phalanges. Subcutaneous emphysema extends dorsally to the mid first metatarsal. 2. Moderate first MTP joint effusion. Septic arthritis is not excluded. 3. Small ulceration at the tip of the second toe with underlying marrow edema with normal T1 signal in the second distal phalanx, likely reactive. Electronically Signed   By: Titus Dubin M.D.   On: 11/17/2017 12:04   Dg Chest Port 1 View  Result Date: 11/16/2017 CLINICAL DATA:  Weakness and generalized pain EXAM: PORTABLE CHEST 1 VIEW COMPARISON:  None. FINDINGS: Lungs are  clear. Heart size and pulmonary vascularity are normal. No adenopathy. No bone lesions. IMPRESSION: No edema or consolidation. Electronically Signed   By: Lowella Grip III M.D.   On: 11/16/2017 11:22   Dg Foot 2 Views Left  Result Date: 11/16/2017 CLINICAL DATA:  Ulcers EXAM: LEFT FOOT - 2 VIEW COMPARISON:  None. FINDINGS: Moderate to advanced degenerative changes at the 1st MTP joint and IP joint. Soft tissue gas noted within the great toe soft tissues and extending along the dorsum of the foot. No definite bony changes of osteomyelitis. No fracture, subluxation or dislocation. IMPRESSION: Extensive soft tissue gas in the left great toe and along the dorsum of the foot. No definite changes of osteomyelitis noted. If there is high clinical suspicion for osteomyelitis, MRI would be more sensitive. Electronically Signed   By: Rolm Baptise M.D.   On: 11/16/2017 11:23   Dg Foot 2 Views Right  Result Date: 11/16/2017 CLINICAL DATA:  Diabetic foot ulcers. EXAM: RIGHT FOOT - 2 VIEW COMPARISON:  None. FINDINGS: No acute fracture or dislocation. Osteolysis of the second distal phalanx. Prominent soft tissue swelling of the second toe with dorsal subcutaneous emphysema extending to the distal metatarsal. Mild osteoarthritis of the first MTP joint. IMPRESSION: 1. Soft tissue swelling and subcutaneous emphysema in the second toe with the extension to the distal metatarsal, consistent with necrotizing infection. Osteomyelitis of the second distal phalanx.  Electronically Signed   By: Titus Dubin M.D.   On: 11/16/2017 11:26   - pertinent xrays, CT, MRI studies were reviewed and independently interpreted  Positive ROS: All other systems have been reviewed and were otherwise negative with the exception of those mentioned in the HPI and as above.  Physical Exam: General: Alert, no acute distress Psychiatric: Patient is competent for consent with normal mood and affect Lymphatic: No axillary or cervical  lymphadenopathy Cardiovascular: No pedal edema Respiratory: No cyanosis, no use of accessory musculature GI: No organomegaly, abdomen is soft and non-tender    Images:  @ENCIMAGES @  Labs:  Lab Results  Component Value Date   HGBA1C 12.2 (H) 11/17/2017   REPTSTATUS PENDING 11/16/2017   CULT  11/16/2017    NO GROWTH < 24 HOURS Performed at [facility] [address], [place], [place] [address]      Neurologic: Patient does not have protective sensation bilateral lower extremities.   MUSCULOSKELETAL:   Skin: Examination of the skin there is swelling necrotic drainage black skin color changes of the forefoot bilaterally.  Patient has a good dorsalis pedis and posterior tibial pulse in both feet.  There is no crepitation extending up the leg no evidence of necrotizing fasciitis.  There are venous stasis changes in both legs but no ulcers.  Review of the MRI scan shows cellulitis and abscess involving the forefoot and cellulitis extending into the midfoot.  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  Plan: Plan: Discussed with the patient we could attempt foot salvage intervention.  Discussed with the patient being a large individual he most likely would do better with a transtibial amputation and there is a high likelihood that the abscess and infection extends into the hindfoot.  We will plan for surgical transmetatarsal amputation tomorrow and if necessary would proceed with further amputation surgery next week.  Risks and benefits were discussed including sepsis and death.  Patient states he understands wished to proceed with surgery.  Thank you for the consult and the opportunity to see Ian. Russel Bond, [place] [phone] 1:16 PM

## 2017-11-17 NOTE — Progress Notes (Signed)
   11/17/17 1100  Clinical Encounter Type  Visited With Family  Visit Type Follow-up  Referral From Chaplain  Consult/Referral To Chaplain  Spiritual Encounters  Spiritual Needs Emotional  Stress Factors  Patient Stress Factors Exhausted  Family Stress Factors Exhausted    Pt was still un available when I visited the second time. Pt still in the MRI but Celesta Gentile was in the room this time round. Pt's fiancee was in great emotional distress. Chaplain and Pt's fiancee had long conversation about the pt. Chaplain provided emotional support through reflective, empathic listening and compassionate presence. Chaplain will revisit again before end of the day.  Dorsey Charette a Medical sales representative, [employer]

## 2017-11-18 ENCOUNTER — Encounter (HOSPITAL_COMMUNITY)
Admission: EM | Disposition: A | Discharge status: Skilled Nursing Facility | Payer: Self-pay | Source: Home / Self Care | Attending: Family Medicine

## 2017-11-18 ENCOUNTER — Inpatient Hospital Stay (HOSPITAL_COMMUNITY): Payer: Medicaid Other | Admitting: Certified Registered Nurse Anesthetist

## 2017-11-18 DIAGNOSIS — I96 Gangrene, not elsewhere classified: Secondary | ICD-10-CM

## 2017-11-18 HISTORY — PX: AMPUTATION TOE: SHX6595

## 2017-11-18 LAB — CBC
HCT: 33.3 % — ABNORMAL LOW (ref 39.0–52.0)
HCT: 32.1 % — ABNORMAL LOW (ref 39.0–52.0)
HEMOGLOBIN: 11.8 g/dL — AB (ref 13.0–17.0)
Hemoglobin: 11.4 g/dL — ABNORMAL LOW (ref 13.0–17.0)
MCH: 28.8 pg (ref 26.0–34.0)
MCH: 28.6 pg (ref 26.0–34.0)
MCHC: 35.4 g/dL (ref 30.0–36.0)
MCHC: 35.5 g/dL (ref 30.0–36.0)
MCV: 81.2 fL (ref 78.0–100.0)
MCV: 80.7 fL (ref 78.0–100.0)
Platelets: 433 10*3/uL — ABNORMAL HIGH (ref 150–400)
Platelets: 460 10*3/uL — ABNORMAL HIGH (ref 150–400)
RBC: 4.1 MIL/uL — ABNORMAL LOW (ref 4.22–5.81)
RBC: 3.98 MIL/uL — AB (ref 4.22–5.81)
RDW: 11.8 % (ref 11.5–15.5)
RDW: 11.9 % (ref 11.5–15.5)
WBC: 18.9 10*3/uL — AB (ref 4.0–10.5)
WBC: 18.1 10*3/uL — ABNORMAL HIGH (ref 4.0–10.5)

## 2017-11-18 LAB — COMPREHENSIVE METABOLIC PANEL
ALBUMIN: 2 g/dL — AB (ref 3.5–5.0)
ALK PHOS: 102 U/L (ref 38–126)
ALT: 16 U/L — AB (ref 17–63)
AST: 22 U/L (ref 15–41)
Anion gap: 9 (ref 5–15)
BUN: 19 mg/dL (ref 6–20)
CHLORIDE: 107 mmol/L (ref 101–111)
CO2: 20 mmol/L — AB (ref 22–32)
CREATININE: 0.98 mg/dL (ref 0.61–1.24)
Calcium: 9.7 mg/dL (ref 8.9–10.3)
GFR calc non Af Amer: >60 mL/min (ref >60)
GLUCOSE: 358 mg/dL — AB (ref 65–99)
Potassium: 3.6 mmol/L (ref 3.5–5.1)
SODIUM: 136 mmol/L (ref 135–145)
Total Bilirubin: 0.7 mg/dL (ref 0.3–1.2)
Total Protein: 7.1 g/dL (ref 6.5–8.1)

## 2017-11-18 LAB — GLUCOSE, CAPILLARY
GLUCOSE-CAPILLARY: 195 mg/dL — AB (ref 65–99)
Glucose-Capillary: 439 mg/dL — ABNORMAL HIGH (ref 65–99)
Glucose-Capillary: 367 mg/dL — ABNORMAL HIGH (ref 65–99)
Glucose-Capillary: 311 mg/dL — ABNORMAL HIGH (ref 65–99)
Glucose-Capillary: 305 mg/dL — ABNORMAL HIGH (ref 65–99)
Glucose-Capillary: 359 mg/dL — ABNORMAL HIGH (ref 65–99)
Glucose-Capillary: 374 mg/dL — ABNORMAL HIGH (ref 65–99)

## 2017-11-18 LAB — VANCOMYCIN, TROUGH: VANCOMYCIN TR: 20 ug/mL (ref 15–20)

## 2017-11-18 LAB — SURGICAL PCR SCREEN
MRSA, PCR: NEGATIVE
Staphylococcus aureus: NEGATIVE

## 2017-11-18 SURGERY — AMPUTATION, TOE
Anesthesia: General | Site: Foot | Laterality: Bilateral

## 2017-11-18 MED ORDER — SUGAMMADEX SODIUM 500 MG/5ML IV SOLN
INTRAVENOUS | Status: AC
  Filled 2017-11-18: qty 5

## 2017-11-18 MED ORDER — METHOCARBAMOL 1000 MG/10ML IJ SOLN
500.0000 mg | Freq: Four times a day (QID) | INTRAMUSCULAR | Status: DC | PRN
  Filled 2017-11-18: qty 5

## 2017-11-18 MED ORDER — ONDANSETRON HCL 4 MG/2ML IJ SOLN
INTRAMUSCULAR | Status: DC | PRN
  Administered 2017-11-18: 4 mg via INTRAVENOUS

## 2017-11-18 MED ORDER — 0.9 % SODIUM CHLORIDE (POUR BTL) OPTIME
TOPICAL | Status: DC | PRN
  Administered 2017-11-18: 1000 mL

## 2017-11-18 MED ORDER — PROPOFOL 10 MG/ML IV BOLUS
INTRAVENOUS | Status: DC | PRN
  Administered 2017-11-18: 130 mg via INTRAVENOUS

## 2017-11-18 MED ORDER — POLYETHYLENE GLYCOL 3350 17 G PO PACK
17.0000 g | PACK | Freq: Every day | ORAL | Status: DC | PRN
  Filled 2017-11-18: qty 1

## 2017-11-18 MED ORDER — OXYCODONE HCL 5 MG PO TABS
5.0000 mg | ORAL_TABLET | ORAL | Status: DC | PRN
  Administered 2017-11-18 – 2017-11-19 (×3): 10 mg via ORAL
  Administered 2017-11-19 – 2017-11-20 (×3): 5 mg via ORAL
  Administered 2017-11-20 – 2017-11-24 (×10): 10 mg via ORAL
  Administered 2017-11-24: 5 mg via ORAL
  Administered 2017-11-24 – 2017-11-26 (×5): 10 mg via ORAL
  Filled 2017-11-18 (×13): qty 2
  Filled 2017-11-18: qty 1
  Filled 2017-11-18 (×3): qty 2
  Filled 2017-11-18: qty 1
  Filled 2017-11-18: qty 2
  Filled 2017-11-18: qty 1
  Filled 2017-11-18 (×4): qty 2

## 2017-11-18 MED ORDER — MIDAZOLAM HCL 2 MG/2ML IJ SOLN
INTRAMUSCULAR | Status: DC | PRN
  Administered 2017-11-18: 1 mg via INTRAVENOUS

## 2017-11-18 MED ORDER — SODIUM CHLORIDE 0.9 % IV SOLN
INTRAVENOUS | Status: DC
Start: 2017-11-18 — End: 2017-11-20
  Administered 2017-11-18: 10:00:00 via INTRAVENOUS

## 2017-11-18 MED ORDER — OXYCODONE HCL 5 MG/5ML PO SOLN: 5.0000 mg | Freq: Once | ORAL | Status: DC | PRN

## 2017-11-18 MED ORDER — FENTANYL CITRATE (PF) 250 MCG/5ML IJ SOLN
INTRAMUSCULAR | Status: DC | PRN
  Administered 2017-11-18: 100 ug via INTRAVENOUS
  Administered 2017-11-18 (×2): 50 ug via INTRAVENOUS

## 2017-11-18 MED ORDER — ONDANSETRON HCL 4 MG/2ML IJ SOLN
4.0000 mg | Freq: Four times a day (QID) | INTRAMUSCULAR | Status: DC | PRN
  Administered 2017-11-18 (×2): 4 mg via INTRAVENOUS
  Filled 2017-11-18: qty 2

## 2017-11-18 MED ORDER — OXYCODONE HCL 5 MG PO TABS
10.0000 mg | ORAL_TABLET | ORAL | Status: DC | PRN
  Administered 2017-11-19 – 2017-11-21 (×2): 10 mg via ORAL
  Filled 2017-11-18 (×2): qty 2

## 2017-11-18 MED ORDER — ONDANSETRON HCL 4 MG/2ML IJ SOLN
INTRAMUSCULAR | Status: AC
Start: 2017-11-18 — End: ?
  Filled 2017-11-18: qty 2

## 2017-11-18 MED ORDER — SUGAMMADEX SODIUM 200 MG/2ML IV SOLN
INTRAVENOUS | Status: DC | PRN
  Administered 2017-11-18: 300 mg via INTRAVENOUS

## 2017-11-18 MED ORDER — MAGNESIUM CITRATE PO SOLN: 1.0000 | Freq: Once | ORAL | Status: DC | PRN

## 2017-11-18 MED ORDER — INSULIN ASPART 100 UNIT/ML ~~LOC~~ SOLN
10.0000 [IU] | Freq: Once | SUBCUTANEOUS | Status: AC
  Administered 2017-11-18: 10 [IU] via INTRAVENOUS

## 2017-11-18 MED ORDER — SUCCINYLCHOLINE CHLORIDE 20 MG/ML IJ SOLN
INTRAMUSCULAR | Status: DC | PRN
  Administered 2017-11-18: 120 mg via INTRAVENOUS

## 2017-11-18 MED ORDER — BISACODYL 10 MG RE SUPP: 10.0000 mg | Freq: Every day | RECTAL | Status: DC | PRN

## 2017-11-18 MED ORDER — ROCURONIUM BROMIDE 10 MG/ML (PF) SYRINGE
PREFILLED_SYRINGE | INTRAVENOUS | Status: AC
  Filled 2017-11-18: qty 5

## 2017-11-18 MED ORDER — LIDOCAINE 2% (20 MG/ML) 5 ML SYRINGE
INTRAMUSCULAR | Status: AC
  Filled 2017-11-18: qty 5

## 2017-11-18 MED ORDER — INSULIN ASPART 100 UNIT/ML ~~LOC~~ SOLN
SUBCUTANEOUS | Status: AC
  Administered 2017-11-18: 10 [IU] via INTRAVENOUS
  Filled 2017-11-18: qty 1

## 2017-11-18 MED ORDER — LACTATED RINGERS IV SOLN
INTRAVENOUS | Status: DC
  Administered 2017-11-18: 07:00:00 via INTRAVENOUS

## 2017-11-18 MED ORDER — FENTANYL CITRATE (PF) 250 MCG/5ML IJ SOLN
INTRAMUSCULAR | Status: AC
  Filled 2017-11-18: qty 5

## 2017-11-18 MED ORDER — GABAPENTIN 300 MG PO CAPS
300.0000 mg | ORAL_CAPSULE | Freq: Three times a day (TID) | ORAL | Status: DC
  Administered 2017-11-18 – 2017-11-27 (×28): 300 mg via ORAL
  Filled 2017-11-18 (×30): qty 1

## 2017-11-18 MED ORDER — INSULIN GLARGINE 100 UNIT/ML ~~LOC~~ SOLN
28.0000 [IU] | SUBCUTANEOUS | Status: DC
  Administered 2017-11-18: 28 [IU] via SUBCUTANEOUS
  Filled 2017-11-18: qty 0.28

## 2017-11-18 MED ORDER — PROPOFOL 10 MG/ML IV BOLUS
INTRAVENOUS | Status: AC
  Filled 2017-11-18: qty 20

## 2017-11-18 MED ORDER — LIDOCAINE 2% (20 MG/ML) 5 ML SYRINGE
INTRAMUSCULAR | Status: DC | PRN
  Administered 2017-11-18: 60 mg via INTRAVENOUS

## 2017-11-18 MED ORDER — ROCURONIUM BROMIDE 10 MG/ML (PF) SYRINGE
PREFILLED_SYRINGE | INTRAVENOUS | Status: DC | PRN
  Administered 2017-11-18: 40 mg via INTRAVENOUS

## 2017-11-18 MED ORDER — FENTANYL CITRATE (PF) 100 MCG/2ML IJ SOLN: 25.0000 ug | INTRAMUSCULAR | Status: DC | PRN

## 2017-11-18 MED ORDER — INSULIN ASPART 100 UNIT/ML ~~LOC~~ SOLN
0.0000 [IU] | Freq: Three times a day (TID) | SUBCUTANEOUS | Status: DC
  Administered 2017-11-18 – 2017-11-19 (×2): 15 [IU] via SUBCUTANEOUS
  Administered 2017-11-19 (×2): 8 [IU] via SUBCUTANEOUS
  Administered 2017-11-20: 11 [IU] via SUBCUTANEOUS
  Administered 2017-11-20 – 2017-11-21 (×3): 8 [IU] via SUBCUTANEOUS
  Administered 2017-11-21: 3 [IU] via SUBCUTANEOUS
  Administered 2017-11-21: 11 [IU] via SUBCUTANEOUS
  Administered 2017-11-22: 3 [IU] via SUBCUTANEOUS

## 2017-11-18 MED ORDER — ACETAMINOPHEN 325 MG PO TABS: 325.0000 mg | ORAL_TABLET | Freq: Four times a day (QID) | ORAL | Status: DC | PRN

## 2017-11-18 MED ORDER — VANCOMYCIN HCL IN DEXTROSE 750-5 MG/150ML-% IV SOLN
750.0000 mg | Freq: Three times a day (TID) | INTRAVENOUS | Status: DC
  Administered 2017-11-18 – 2017-11-20 (×6): 750 mg via INTRAVENOUS
  Filled 2017-11-18 (×8): qty 150

## 2017-11-18 MED ORDER — INSULIN ASPART 100 UNIT/ML ~~LOC~~ SOLN: 4.0000 [IU] | Freq: Three times a day (TID) | SUBCUTANEOUS | Status: DC

## 2017-11-18 MED ORDER — ONDANSETRON HCL 4 MG PO TABS
4.0000 mg | ORAL_TABLET | Freq: Four times a day (QID) | ORAL | Status: DC | PRN
  Filled 2017-11-18: qty 1

## 2017-11-18 MED ORDER — METOCLOPRAMIDE HCL 5 MG/ML IJ SOLN: 5.0000 mg | Freq: Three times a day (TID) | INTRAMUSCULAR | Status: DC | PRN

## 2017-11-18 MED ORDER — METOCLOPRAMIDE HCL 5 MG PO TABS: 5.0000 mg | ORAL_TABLET | Freq: Three times a day (TID) | ORAL | Status: DC | PRN

## 2017-11-18 MED ORDER — OXYCODONE HCL 5 MG PO TABS: 5.0000 mg | ORAL_TABLET | Freq: Once | ORAL | Status: DC | PRN

## 2017-11-18 MED ORDER — MIDAZOLAM HCL 2 MG/2ML IJ SOLN
INTRAMUSCULAR | Status: AC
  Filled 2017-11-18: qty 2

## 2017-11-18 MED ORDER — METHOCARBAMOL 500 MG PO TABS
500.0000 mg | ORAL_TABLET | Freq: Four times a day (QID) | ORAL | Status: DC | PRN
  Administered 2017-11-19 – 2017-11-20 (×2): 500 mg via ORAL
  Filled 2017-11-18 (×3): qty 1

## 2017-11-18 MED ORDER — SUCCINYLCHOLINE CHLORIDE 200 MG/10ML IV SOSY
PREFILLED_SYRINGE | INTRAVENOUS | Status: AC
  Filled 2017-11-18: qty 10

## 2017-11-18 MED ORDER — HYDROMORPHONE HCL 1 MG/ML IJ SOLN
0.5000 mg | INTRAMUSCULAR | Status: DC | PRN
  Administered 2017-11-19 – 2017-11-20 (×2): 1 mg via INTRAVENOUS
  Filled 2017-11-18 (×2): qty 1

## 2017-11-18 MED ORDER — INSULIN ASPART 100 UNIT/ML ~~LOC~~ SOLN
0.0000 [IU] | Freq: Every day | SUBCUTANEOUS | Status: DC
  Administered 2017-11-18: 5 [IU] via SUBCUTANEOUS
  Administered 2017-11-19: 3 [IU] via SUBCUTANEOUS

## 2017-11-18 MED ORDER — DOCUSATE SODIUM 100 MG PO CAPS
100.0000 mg | ORAL_CAPSULE | Freq: Two times a day (BID) | ORAL | Status: DC
  Filled 2017-11-18 (×4): qty 1

## 2017-11-18 SURGICAL SUPPLY — 29 items
BLADE SAW SGTL HD 18.5X60.5X1. (BLADE) ×3 IMPLANT
BLADE SURG 21 STRL SS (BLADE) ×3 IMPLANT
BNDG COHESIVE 4X5 TAN STRL (GAUZE/BANDAGES/DRESSINGS) ×4 IMPLANT
BNDG GAUZE ELAST 4 BULKY (GAUZE/BANDAGES/DRESSINGS) ×4 IMPLANT
COVER SURGICAL LIGHT HANDLE (MISCELLANEOUS) ×3 IMPLANT
DRAPE INCISE IOBAN 66X45 STRL (DRAPES) ×3 IMPLANT
DRAPE U-SHAPE 47X51 STRL (DRAPES) ×5 IMPLANT
DRSG ADAPTIC 3X8 NADH LF (GAUZE/BANDAGES/DRESSINGS) ×4 IMPLANT
DURAPREP 26ML APPLICATOR (WOUND CARE) ×5 IMPLANT
ELECT REM PT RETURN 9FT ADLT (ELECTROSURGICAL) ×3
ELECTRODE REM PT RTRN 9FT ADLT (ELECTROSURGICAL) ×1 IMPLANT
GAUZE SPONGE 4X4 12PLY STRL (GAUZE/BANDAGES/DRESSINGS) ×2 IMPLANT
GLOVE BIOGEL PI IND STRL 9 (GLOVE) ×1 IMPLANT
GLOVE BIOGEL PI INDICATOR 9 (GLOVE) ×2
GLOVE SURG ORTHO 9.0 STRL STRW (GLOVE) ×3 IMPLANT
GOWN STRL REUS W/ TWL XL LVL3 (GOWN DISPOSABLE) ×3 IMPLANT
GOWN STRL REUS W/TWL XL LVL3 (GOWN DISPOSABLE) ×6
KIT BASIN OR (CUSTOM PROCEDURE TRAY) ×3 IMPLANT
KIT TURNOVER KIT B (KITS) ×3 IMPLANT
NS IRRIG 1000ML POUR BTL (IV SOLUTION) ×3 IMPLANT
PACK ORTHO EXTREMITY (CUSTOM PROCEDURE TRAY) ×3 IMPLANT
PAD ABD 8X10 STRL (GAUZE/BANDAGES/DRESSINGS) ×4 IMPLANT
PAD ARMBOARD 7.5X6 YLW CONV (MISCELLANEOUS) ×4 IMPLANT
SPONGE LAP 18X18 X RAY DECT (DISPOSABLE) ×2 IMPLANT
SUT ETHILON 2 0 PSLX (SUTURE) ×10 IMPLANT
TOWEL OR 17X26 10 PK STRL BLUE (TOWEL DISPOSABLE) ×3 IMPLANT
TUBE CONNECTING 12'X1/4 (SUCTIONS) ×1
TUBE CONNECTING 12X1/4 (SUCTIONS) ×2 IMPLANT
YANKAUER SUCT BULB TIP NO VENT (SUCTIONS) ×3 IMPLANT

## 2017-11-18 NOTE — Progress Notes (Signed)
Orthopedic Tech Progress Note Patient Details:  Ian Bond 1976-03-11 [id number]  Ortho Devices Type of Ortho Device: Postop shoe/boot Ortho Device/Splint Location: bilateral Ortho Device/Splint Interventions: Application   Post Interventions Patient Tolerated: Well Instructions Provided: Care of device   [name] 11/18/2017, 10:35 AM

## 2017-11-18 NOTE — Anesthesia Postprocedure Evaluation (Signed)
Anesthesia Post Note  Patient: Agastya Rieth  Procedure(s) Performed: BILATERAL TRANSMETETARSAL AMPUTATION (Bilateral Foot)     Patient location during evaluation: PACU Anesthesia Type: General Level of consciousness: awake and alert Pain management: pain level controlled Vital Signs Assessment: post-procedure vital signs reviewed and stable Respiratory status: spontaneous breathing, nonlabored ventilation, respiratory function stable and patient connected to nasal cannula oxygen Cardiovascular status: blood pressure returned to baseline and stable Postop Assessment: no apparent nausea or vomiting Anesthetic complications: no    Last Vitals:  Vitals:   11/18/17 1400 11/18/17 1514  BP: (!) 147/94 132/76  Pulse: 98 97  Resp: 20 (!) 23  Temp:  37 C  SpO2: 100% 100%    Last Pain:  Vitals:   11/18/17 1524  TempSrc:   PainSc: 2                  Stony Stegmann

## 2017-11-18 NOTE — Progress Notes (Signed)
[facility] TEAM 1 - Stepdown/ICU TEAM  Ian Bond  WVP:[id number] DOB: 1975/09/25 DOA: 11/16/2017 PCP: Patient, No Pcp Per    Brief Narrative:  42yo obese M who has not sought medical care in years, and was admitted with nausea/vomiting and shortness of breath.  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  Exam revealed foul-smelling necrotic toes on both feet  Significant Events: 4/25 admit - DKA - diabetic foot wounds 4/27 transmetatarsal amputation B LE   Subjective: To OR today per Dr. Sharol Given.   Doing well post-op.  Denies cp, sob, n/v.  Modest pain in feet B.     Assessment & Plan:  DKA - Uncontrolled Newly diagnosed DM2 A1c 12.2 - DKA resolved - CBG variable postoperatively - adjust tx and follow - begin DM education   B Diabetic foot wounds - gangrene of toes  Ortho following - now s/p B transmetatarsal amputations - wound care and rehab per Ortho  Mild hypokalemia  Corrected   Acute kidney injury Resolved   Etoh abuse  3-4 beers per day - no evidence of withdrawal thus far  Obesity - Body mass index is 32.52 kg/m.   DVT prophylaxis: SQ heparin  Code Status: FULL CODE Family Communication: spoke w/ visitors at bedside   Disposition Plan:   Consultants:  PCCM Ortopedics   Antimicrobials:  Vanc 4/25 > Zosyn 4/25 >  Objective: Blood pressure (!) 147/94, pulse 98, temperature (!) 97.5 F (36.4 C), temperature source Oral, resp. rate 20, height 6\' 3"  (1.905 m), weight 118 kg (260 lb 2.3 oz), SpO2 100 %.  Intake/Output Summary (Last 24 hours) at 11/18/2017 1456 Last data filed at 11/18/2017 1400 Gross per 24 hour  Intake 2139.84 ml  Output 4075 ml  Net -1935.16 ml   Filed Weights   11/16/17 0946 11/16/17 1343  Weight: 127 kg (280 lb) 118 kg (260 lb 2.3 oz)    Examination: General: No acute respiratory distress Lungs: CTA B - no wheezing  Cardiovascular: RRR - no M or rub  Abdomen: NT/ND, soft, bs+, no mass  Extremities: trace edema  B LE w/o change   CBC: Recent Labs  Lab 11/16/17 0825  11/16/17 1215 11/17/17 0313 11/18/17 0246  WBC 28.1*  --   --  18.9* 18.1*  HGB 14.3   < > 15.3 11.8* 11.4*  HCT 41.7   < > 45.0 33.3* 32.1*  MCV 87.4  --   --  81.2 80.7  PLT 642*  --   --  433* 460*   < > = values in this interval not displayed.   Basic Metabolic Panel: Recent Labs  Lab 11/16/17 1221 11/16/17 1529  11/17/17 0313 11/17/17 0703 11/18/17 0246  NA 131* 133*   < > 138 136 136  K 4.9 4.3   < > 3.9 3.4* 3.6  CL 95* 103   < > 108 106 107  CO2 7* 14*   < > 18* 20* 20*  GLUCOSE 585* 297*   < > 112* 98 358*  BUN 49* 43*   < > 28* 23* 19  CREATININE 2.35* 1.73*   < > 0.96 0.89 0.98  CALCIUM 9.1 8.9   < > 9.3 9.4 9.7  PHOS 4.2 1.6*  --  2.1*  --   --    < > = values in this interval not displayed.   GFR: Estimated Creatinine Clearance: 134.6 mL/min (by C-G formula based on SCr of 0.98 mg/dL).  Liver Function Tests: Recent Labs  Lab 11/16/17 0825 11/18/17 0246  AST 23 22  ALT 18 16*  ALKPHOS 171* 102  BILITOT 1.9* 0.7  PROT 9.2* 7.1  ALBUMIN 2.6* 2.0*   Recent Labs  Lab 11/16/17 0825  LIPASE 77*      HbA1C: Hgb A1c MFr Bld  Date/Time Value Ref Range Status  11/17/2017 03:13 AM 12.2 (H) 4.8 - 5.6 % Final    Comment:    (NOTE) Pre diabetes:          5.7%-6.4% Diabetes:              >6.4% Glycemic control for   <7.0% adults with diabetes     CBG: Recent Labs  Lab 11/17/17 2328 11/18/17 0315 11/18/17 0708 11/18/17 0901 11/18/17 1157  GLUCAP 439* 367* 311* 195* 305*    Recent Results (from the past 240 hour(s))  MRSA PCR Screening     Status: None   Collection Time: 11/16/17  1:51 PM  Result Value Ref Range Status   MRSA by PCR NEGATIVE NEGATIVE Final    Comment:        The GeneXpert MRSA Assay (FDA approved for NASAL specimens only), is one component of a comprehensive MRSA colonization surveillance program. It is not intended to diagnose MRSA infection nor to guide  or monitor treatment for MRSA infections. Performed at [facility], [place] [address] [place], [place] [address]   Culture, blood (routine x 2)     Status: None (Preliminary result)   Collection Time: 11/16/17  1:55 PM  Result Value Ref Range Status   Specimen Description BLOOD HAND  Final   Special Requests   Final    BOTTLES DRAWN AEROBIC AND ANAEROBIC Blood Culture results may not be optimal due to an inadequate volume of blood received in culture bottles   Culture   Final    NO GROWTH 2 DAYS Performed at [facility], [place] [address], [place], [place] [address]    Report Status PENDING  Incomplete  Culture, blood (routine x 2)     Status: None (Preliminary result)   Collection Time: 11/16/17  2:00 PM  Result Value Ref Range Status   Specimen Description BLOOD LEFT HAND  Final   Special Requests   Final    BOTTLES DRAWN AEROBIC ONLY Blood Culture adequate volume   Culture   Final    NO GROWTH 2 DAYS Performed at [facility], [place] [address], [place], [place] [address]    Report Status PENDING  Incomplete  Surgical pcr screen     Status: None   Collection Time: 11/18/17 12:00 AM  Result Value Ref Range Status   MRSA, PCR NEGATIVE NEGATIVE Final   Staphylococcus aureus NEGATIVE NEGATIVE Final    Comment: (NOTE) The Xpert SA Assay (FDA approved for NASAL specimens in patients 56 years of age and older), is one component of a comprehensive surveillance program. It is not intended to diagnose infection nor to guide or monitor treatment. Performed at [facility], [place] [address] [place], [place] [address]      Scheduled Meds: . chlorhexidine  15 mL Mouth Rinse BID  . docusate sodium  100 mg Oral BID  . feeding supplement (GLUCERNA SHAKE)  237 mL Oral BID BM  . gabapentin  300 mg Oral TID  . heparin  5,000 Units Subcutaneous Q8H  . insulin aspart  0-9 Units Subcutaneous Q4H  . insulin glargine  24  Units Subcutaneous Q24H  . mouth rinse  15 mL Mouth  Rinse q12n4p  . metoprolol tartrate  5 mg Intravenous Q8H     LOS: 2 days   Cherene Altes, MD [facility] Office  [phone] Pager - Text Page per Amion as per below:  On-Call/Text Page:      [email or web address]      password [email or web address]  If 7PM-7AM, please contact night-coverage [email or web address] Password [facility] 11/18/2017, 2:56 PM

## 2017-11-18 NOTE — Transfer of Care (Signed)
Immediate Anesthesia Transfer of Care Note  Patient: Ian Bond  Procedure(s) Performed: BILATERAL TRANSMETETARSAL AMPUTATION (Bilateral Foot)  Patient Location: PACU  Anesthesia Type:General  Level of Consciousness: drowsy  Airway & Oxygen Therapy: Patient Spontanous Breathing and Patient connected to nasal cannula oxygen  Post-op Assessment: Report given to RN and Post -op Vital signs reviewed and stable  Post vital signs: Reviewed and stable  Last Vitals:  Vitals Value Taken Time  BP 120/85 11/18/2017  8:56 AM  Temp    Pulse 99 11/18/2017  8:56 AM  Resp 19 11/18/2017  8:56 AM  SpO2 99 % 11/18/2017  8:56 AM  Vitals shown include unvalidated device data.  Last Pain:  Vitals:   11/18/17 0400  TempSrc:   PainSc: Asleep         Complications: No apparent anesthesia complications

## 2017-11-18 NOTE — Progress Notes (Signed)
Pharmacy Antibiotic Note  Ian Bond is a 42 y.o. 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  Pharmacy has been consulted for Vancomycin/Zosyn dosing.  Patient is s/p bilateral transmetatarsal amputation today. WBC down to 18.1. A 7-hr Vanc trough today is on the upper end of goal range with potential for further accumulation.   Plan: Decrease vancomycin to 750 mg IV q8h  Zosyn 3.375g IV q8h (4h inf) Monitor clinical progress, blood cultures, renal function, s/sx of infection, and LOT. Per ortho, continue antibiotics for 72 hours post amputation   Height: 6\' 3"  (190.5 cm) Weight: 260 lb 2.3 oz (118 kg) IBW/kg (Calculated) : 84.5  Temp (24hrs), Avg:98.5 F (36.9 C), Min:97.5 F (36.4 C), Max:99.1 F (37.3 C)  Recent Labs  Lab 11/16/17 0825 11/16/17 0905 11/16/17 1029  11/16/17 1358 11/16/17 1529 11/16/17 2135 11/17/17 0105 11/17/17 0313 11/17/17 0703 11/18/17 0246 11/18/17 1031  WBC 28.1*  --   --   --   --   --   --   --  18.9*  --  18.1*  --   CREATININE 2.57*  --   --    < >  --  1.73* 1.21 1.01 0.96 0.89 0.98  --   LATICACIDVEN  --  4.47* 3.98*  --  2.4* 1.9  --   --   --   --   --   --   VANCOTROUGH  --   --   --   --   --   --   --   --   --   --   --  20   < > = values in this interval not displayed.    Estimated Creatinine Clearance: 134.6 mL/min (by C-G formula based on SCr of 0.98 mg/dL).    No Known Allergies  Antimicrobials this admission: 4/25 Vanc >>  4/25 Zosyn >>   Dose adjustments this admission: Vanc 1000mg   q12h > q8h (improved renal function) 4/27: 7hr VT 20 on 1 gm IV Q 8 hours - decrease to 750 mg q 8h   Microbiology results: 4/25 BCx: NGTD 4/25 MRSA PCR: neg  Thank you for allowing pharmacy to be a part of this patient's care.  Albertina Parr, PharmD., BCPS Clinical Pharmacist Clinical phone for 11/18/17 until 3:30pm: [phone] If after 3:30pm, please call main pharmacy at: [phone]

## 2017-11-18 NOTE — Interval H&P Note (Signed)
History and Physical Interval Note:  11/18/2017 7:41 AM  Ian Bond  has presented today for surgery, with the diagnosis of Abcess and osteomyelitis bilateral feet  The various methods of treatment have been discussed with the patient and family. After consideration of risks, benefits and other options for treatment, the patient has consented to  Procedure(s): BILATERAL TRANSMETETARSAL AMPUTATION (Bilateral) as a surgical intervention .  The patient's history has been reviewed, patient examined, no change in status, stable for surgery.  I have reviewed the patient's chart and labs.  Questions were answered to the patient's satisfaction.     Newt Minion

## 2017-11-18 NOTE — Anesthesia Procedure Notes (Signed)
Procedure Name: Intubation Date/Time: 11/18/2017 7:54 AM Performed by: Bryson Corona, CRNA Pre-anesthesia Checklist: Patient identified, Emergency Drugs available, Suction available and Patient being monitored Patient Re-evaluated:Patient Re-evaluated prior to induction Oxygen Delivery Method: Circle System Utilized Preoxygenation: Pre-oxygenation with 100% oxygen Induction Type: IV induction Ventilation: Mask ventilation without difficulty Laryngoscope Size: Mac and 4 Grade View: Grade II Tube type: Oral Tube size: 7.0 mm Number of attempts: 1 Airway Equipment and Method: Stylet and Oral airway Placement Confirmation: ETT inserted through vocal cords under direct vision,  positive ETCO2 and breath sounds checked- equal and bilateral Secured at: 22 cm Tube secured with: Tape Dental Injury: Teeth and Oropharynx as per pre-operative assessment

## 2017-11-18 NOTE — Op Note (Signed)
     Date of Surgery: 11/18/2017  INDICATIONS: Ian Bond is a 42 y.o.-year-old male who presents with gangrene and abscess both forefoot.  PREOPERATIVE DIAGNOSIS: gangrene abscess osteomyelitis both feet  POSTOPERATIVE DIAGNOSIS: Same.  PROCEDURE: Transmetatarsal amputation bilateral   SURGEON: Sharol Given, M.D.  ANESTHESIA:  general  IV FLUIDS AND URINE: See anesthesia.  ESTIMATED BLOOD LOSS: 200 mL.  COMPLICATIONS: None.  DESCRIPTION OF PROCEDURE: The patient was brought to the operating room and underwent a general anesthetic. After adequate levels of anesthesia were obtained patient's lower extremity was prepped using DuraPrep draped into a sterile field. A timeout was called.  A fishmouth incision was made just proximal to the ulcerative nonviable tissue. This was carried sharply down to bone. A oscillating saw was used to perform a transmetatarsal amputation with a gentle cascade of the metatarsals and beveled plantarly. Electrocautery was used for hemostasis. The wound was irrigated with normal saline. The incision was closed using 2-0 nylon. The procedure was performed bilateral Patient was taken to the PACU in stable condition.   DISCHARGE PLANNING:  Antibiotic duration:72 hours post operative  Weightbearing: only for transfers, no gait training  Pain medication: ordered  Dressing care/ Wound FYT:WKMQ dressing dry  Discharge to: home or SNF  Follow-up: In the office 1 week post operative.  Meridee Score, MD [place] 9:05 AM

## 2017-11-18 NOTE — Anesthesia Preprocedure Evaluation (Signed)
Anesthesia Evaluation  Patient identified by MRN, date of birth, ID band Patient awake    Reviewed: Allergy & Precautions, NPO status , Patient's Chart, lab work & pertinent test results  History of Anesthesia Complications Negative for: history of anesthetic complications  Airway Mallampati: IV  TM Distance: >3 FB Neck ROM: Full  Mouth opening: Limited Mouth Opening  Dental  (+) Teeth Intact   Pulmonary neg pulmonary ROS,    breath sounds clear to auscultation       Cardiovascular negative cardio ROS   Rhythm:Regular     Neuro/Psych negative neurological ROS  negative psych ROS   GI/Hepatic Neg liver ROS, nauseated   Endo/Other  diabetes, Poorly Controlled, Insulin DependentMorbid obesity  Renal/GU ARFRenal diseaseOn admission--> resolved     Musculoskeletal   Abdominal   Peds  Hematology  (+) anemia ,   Anesthesia Other Findings   Reproductive/Obstetrics                             Anesthesia Physical Anesthesia Plan  ASA: III  Anesthesia Plan: General   Post-op Pain Management:    Induction: Intravenous, Rapid sequence and Cricoid pressure planned  PONV Risk Score and Plan: 2 and Ondansetron and Treatment may vary due to age or medical condition  Airway Management Planned: Oral ETT  Additional Equipment: None  Intra-op Plan:   Post-operative Plan: Extubation in OR  Informed Consent: I have reviewed the patients History and Physical, chart, labs and discussed the procedure including the risks, benefits and alternatives for the proposed anesthesia with the patient or authorized representative who has indicated his/her understanding and acceptance.   Dental advisory given  Plan Discussed with: CRNA and Surgeon  Anesthesia Plan Comments:         Anesthesia Quick Evaluation

## 2017-11-19 ENCOUNTER — Encounter (HOSPITAL_COMMUNITY): Payer: Self-pay | Admitting: Orthopedic Surgery

## 2017-11-19 LAB — COMPREHENSIVE METABOLIC PANEL
ALBUMIN: 1.7 g/dL — AB (ref 3.5–5.0)
ALK PHOS: 96 U/L (ref 38–126)
ALT: 15 U/L — AB (ref 17–63)
AST: 17 U/L (ref 15–41)
Anion gap: 12 (ref 5–15)
BILIRUBIN TOTAL: 0.8 mg/dL (ref 0.3–1.2)
BUN: 15 mg/dL (ref 6–20)
CALCIUM: 8.9 mg/dL (ref 8.9–10.3)
CO2: 24 mmol/L (ref 22–32)
Chloride: 99 mmol/L — ABNORMAL LOW (ref 101–111)
Creatinine, Ser: 1.04 mg/dL (ref 0.61–1.24)
GFR calc Af Amer: >60 mL/min (ref >60)
GFR calc non Af Amer: >60 mL/min (ref >60)
GLUCOSE: 307 mg/dL — AB (ref 65–99)
Potassium: 3.7 mmol/L (ref 3.5–5.1)
Sodium: 135 mmol/L (ref 135–145)
TOTAL PROTEIN: 6.5 g/dL (ref 6.5–8.1)

## 2017-11-19 LAB — CBC
HEMATOCRIT: 26.9 % — AB (ref 39.0–52.0)
Hemoglobin: 9.3 g/dL — ABNORMAL LOW (ref 13.0–17.0)
MCH: 28.4 pg (ref 26.0–34.0)
MCHC: 34.6 g/dL (ref 30.0–36.0)
MCV: 82 fL (ref 78.0–100.0)
Platelets: 391 10*3/uL (ref 150–400)
RBC: 3.28 MIL/uL — ABNORMAL LOW (ref 4.22–5.81)
RDW: 12 % (ref 11.5–15.5)
WBC: 21.4 10*3/uL — ABNORMAL HIGH (ref 4.0–10.5)

## 2017-11-19 LAB — GLUCOSE, CAPILLARY
GLUCOSE-CAPILLARY: 369 mg/dL — AB (ref 65–99)
GLUCOSE-CAPILLARY: 255 mg/dL — AB (ref 65–99)
Glucose-Capillary: 299 mg/dL — ABNORMAL HIGH (ref 65–99)
Glucose-Capillary: 300 mg/dL — ABNORMAL HIGH (ref 65–99)

## 2017-11-19 MED ORDER — INSULIN GLARGINE 100 UNIT/ML ~~LOC~~ SOLN
35.0000 [IU] | SUBCUTANEOUS | Status: DC
  Administered 2017-11-19 – 2017-11-20 (×2): 35 [IU] via SUBCUTANEOUS
  Filled 2017-11-19 (×2): qty 0.35

## 2017-11-19 MED ORDER — LIVING WELL WITH DIABETES BOOK
Freq: Once | Status: AC
  Administered 2017-11-20: 08:00:00
  Filled 2017-11-19: qty 1

## 2017-11-19 MED ORDER — INSULIN GLARGINE 100 UNIT/ML ~~LOC~~ SOLN
30.0000 [IU] | SUBCUTANEOUS | Status: DC
  Filled 2017-11-19: qty 0.3

## 2017-11-19 MED ORDER — INSULIN STARTER KIT- SYRINGES (ENGLISH)
1.0000 | Freq: Once | Status: AC
  Administered 2017-11-20: 1
  Filled 2017-11-19: qty 1

## 2017-11-19 MED ORDER — SACCHAROMYCES BOULARDII 250 MG PO CAPS
250.0000 mg | ORAL_CAPSULE | Freq: Two times a day (BID) | ORAL | Status: DC
  Administered 2017-11-19 – 2017-11-27 (×17): 250 mg via ORAL
  Filled 2017-11-19 (×16): qty 1

## 2017-11-19 MED ORDER — INSULIN ASPART 100 UNIT/ML ~~LOC~~ SOLN
4.0000 [IU] | Freq: Three times a day (TID) | SUBCUTANEOUS | Status: DC
  Administered 2017-11-19 – 2017-11-22 (×8): 4 [IU] via SUBCUTANEOUS

## 2017-11-19 NOTE — Progress Notes (Signed)
Ian Bond  IWO:[id number] DOB: 24-Aug-1975 DOA: 11/16/2017 PCP: Patient, No Pcp Per    Brief Narrative:  42yo obese M who has not sought medical care in years, and was admitted with nausea/vomiting and shortness of breath.  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  Exam revealed foul-smelling necrotic toes on both feet  Significant Events: 4/25 admit - DKA - diabetic foot wounds 4/27 transmetatarsal amputation B LE   Subjective: To OR today per Dr. Sharol Given.   Doing well post-op.  Denies cp, sob, n/v.  Modest pain in feet B.     Assessment & Plan:  DKA - Uncontrolled Newly diagnosed DM2 A1c 12.2 - DKA resolved - Will increase lantus to 35 units at HS. cbg in the 300 range in the morning.  Will add meals coverage.   B Diabetic foot wounds - gangrene of toes  Ortho following - now s/p B transmetatarsal amputations -  wound care and rehab per Ortho Leukocytosis. Continue with IV antibiotics.   Mild hypokalemia  resollved  Acute kidney injury Resolved   Etoh abuse  3-4 beers per day -  No evidence of withdrawal.   Obesity - Body mass index is 32.46 kg/m.   DVT prophylaxis: SQ heparin  Code Status: FULL CODE Family Communication: discussed with patient.  Disposition Plan:   Consultants:  PCCM Ortopedics   Antimicrobials:  Vanc 4/25 > Zosyn 4/25 >  Objective: Blood pressure 121/79, pulse 84, temperature 99.3 F (37.4 C), temperature source Oral, resp. rate 20, height '6\' 3"'  (1.905 m), weight 117.8 kg (259 lb 11.2 oz), SpO2 94 %.  Intake/Output Summary (Last 24 hours) at 11/19/2017 1657 Last data filed at 11/19/2017 1600 Gross per 24 hour  Intake 1190 ml  Output 2600 ml  Net -1410 ml   Filed Weights   11/16/17 0946 11/16/17 1343 11/19/17 0424  Weight: 127 kg (280 lb) 118 kg (260 lb 2.3 oz) 117.8 kg (259 lb 11.2 oz)    Examination: General: NAD Lungs: Normal respiratory effort, CTA Cardiovascular: S 1,. S 2 RRR Abdomen: BS present,  soft, nt Extremities: bilateral LE with dressing.   CBC: Recent Labs  Lab 11/17/17 0313 11/18/17 0246 11/19/17 0421  WBC 18.9* 18.1* 21.4*  HGB 11.8* 11.4* 9.3*  HCT 33.3* 32.1* 26.9*  MCV 81.2 80.7 82.0  PLT 433* 460* 500   Basic Metabolic Panel: Recent Labs  Lab 11/16/17 1221 11/16/17 1529  11/17/17 0313 11/17/17 0703 11/18/17 0246 11/19/17 0421  NA 131* 133*   < > 138 136 136 135  K 4.9 4.3   < > 3.9 3.4* 3.6 3.7  CL 95* 103   < > 108 106 107 99*  CO2 7* 14*   < > 18* 20* 20* 24  GLUCOSE 585* 297*   < > 112* 98 358* 307*  BUN 49* 43*   < > 28* 23* 19 15  CREATININE 2.35* 1.73*   < > 0.96 0.89 0.98 1.04  CALCIUM 9.1 8.9   < > 9.3 9.4 9.7 8.9  PHOS 4.2 1.6*  --  2.1*  --   --   --    < > = values in this interval not displayed.   GFR: Estimated Creatinine Clearance: 126.7 mL/min (by C-G formula based on SCr of 1.04 mg/dL).  Liver Function Tests: Recent Labs  Lab 11/16/17 0825 11/18/17 0246 11/19/17 0421  AST '23 22 17  ' ALT 18 16* 15*  ALKPHOS 171* 102  96  BILITOT 1.9* 0.7 0.8  PROT 9.2* 7.1 6.5  ALBUMIN 2.6* 2.0* 1.7*   Recent Labs  Lab 11/16/17 0825  LIPASE 77*      HbA1C: Hgb A1c MFr Bld  Date/Time Value Ref Range Status  11/17/2017 03:13 AM 12.2 (H) 4.8 - 5.6 % Final    Comment:    (NOTE) Pre diabetes:          5.7%-6.4% Diabetes:              >6.4% Glycemic control for   <7.0% adults with diabetes     CBG: Recent Labs  Lab 11/18/17 1621 11/18/17 2010 11/19/17 0757 11/19/17 1242 11/19/17 1545  GLUCAP 359* 374* 299* 300* 369*    Recent Results (from the past 240 hour(s))  MRSA PCR Screening     Status: None   Collection Time: 11/16/17  1:51 PM  Result Value Ref Range Status   MRSA by PCR NEGATIVE NEGATIVE Final    Comment:        The GeneXpert MRSA Assay (FDA approved for NASAL specimens only), is one component of a comprehensive MRSA colonization surveillance program. It is not intended to diagnose MRSA infection nor to  guide or monitor treatment for MRSA infections. Performed at [facility], [place] [address], [place], [place] [address]   Culture, blood (routine x 2)     Status: None (Preliminary result)   Collection Time: 11/16/17  1:55 PM  Result Value Ref Range Status   Specimen Description BLOOD HAND  Final   Special Requests   Final    BOTTLES DRAWN AEROBIC AND ANAEROBIC Blood Culture results may not be optimal due to an inadequate volume of blood received in culture bottles   Culture   Final    NO GROWTH 3 DAYS Performed at [facility], [place] [address], [place], [place] [address]    Report Status PENDING  Incomplete  Culture, blood (routine x 2)     Status: None (Preliminary result)   Collection Time: 11/16/17  2:00 PM  Result Value Ref Range Status   Specimen Description BLOOD LEFT HAND  Final   Special Requests   Final    BOTTLES DRAWN AEROBIC ONLY Blood Culture adequate volume   Culture   Final    NO GROWTH 3 DAYS Performed at [facility], [place] [address] [place], [place] [address]    Report Status PENDING  Incomplete  Surgical pcr screen     Status: None   Collection Time: 11/18/17 12:00 AM  Result Value Ref Range Status   MRSA, PCR NEGATIVE NEGATIVE Final   Staphylococcus aureus NEGATIVE NEGATIVE Final    Comment: (NOTE) The Xpert SA Assay (FDA approved for NASAL specimens in patients 73 years of age and older), is one component of a comprehensive surveillance program. It is not intended to diagnose infection nor to guide or monitor treatment. Performed at [facility], [place] [address], [place], [place] [address]      Scheduled Meds: . chlorhexidine  15 mL Mouth Rinse BID  . docusate sodium  100 mg Oral BID  . feeding supplement (GLUCERNA SHAKE)  237 mL Oral BID BM  . gabapentin  300 mg Oral TID  . heparin  5,000 Units Subcutaneous Q8H  . insulin aspart  0-15 Units Subcutaneous TID WC  . insulin aspart  0-5 Units Subcutaneous QHS  . insulin aspart   4 Units Subcutaneous TID WC  . insulin glargine  35 Units Subcutaneous Q24H  . insulin starter kit- syringes  1 kit Other Once  . living well with diabetes book   Does not apply Once  . mouth rinse  15 mL Mouth Rinse q12n4p  . metoprolol tartrate  5 mg Intravenous Q8H  . saccharomyces boulardii  250 mg Oral BID     LOS: 3 days   [place], [place] [phone]  If 7PM-7AM, please contact night-coverage [email or web address] Password [facility] 11/19/2017, 4:57 PM

## 2017-11-19 NOTE — Progress Notes (Signed)
Inpatient Diabetes Program Recommendations  AACE/ADA: New Consensus Statement on Inpatient Glycemic Control (2015)  Target Ranges:  Prepandial:   less than 140 mg/dL      Peak postprandial:   less than 180 mg/dL (1-2 hours)      Critically ill patients:  140 - 180 mg/dL   Results for Ian Bond, Ian Bond (MRN [id number]) as of 11/19/2017 11:00  Ref. Range 11/17/2017 23:28 11/18/2017 03:15 11/18/2017 07:08 11/18/2017 09:01 11/18/2017 11:57 11/18/2017 16:21 11/18/2017 20:10  Glucose-Capillary Latest Ref Range: 65 - 99 mg/dL 439 (H)  9 units NOVOLOG 367 (H)  9 units NOVOLOG 311 (H)  10 units NOVOLOG 195 (H)   305 (H)  7 units NOVOLOG 359 (H)  15 units NOVOLOG 374 (H)  5 units NOVOLOG +  28 units LANTUS    Results for Ian Bond, Ian Bond (MRN [id number]) as of 11/19/2017 11:00  Ref. Range 11/19/2017 07:57  Glucose-Capillary Latest Ref Range: 65 - 99 mg/dL 299 (H)  8 units NOVOLOG   Results for Ian Bond, Ian Bond (MRN [id number]) as of 11/19/2017 11:00  Ref. Range 11/17/2017 03:13  Hemoglobin A1C Latest Ref Range: 4.8 - 5.6 % 12.2 (H)     New diagnosis of DM   Current Orders: Lantus 30 units daily      Novolog Moderate Correction Scale/ SSI (0-15 units) TID AC + HS    Note patient underwent bilateral Transmetatarsal amputations yesterday (04/27).  Back on solid PO diet today.  Will have the DM Coordinator follow up with patient on Monday to discuss new diagnosis of DM.  Looks like patient will likely need insulin at time of discharge given A1c of 12.2%     MD- Please consider the following in-hospital insulin adjustments:  1. Increase Lantus to 35 units QHS  2. Start Novolog Meal Coverage: Novolog 4 units TID with meals (hold if pt eats <50% of meal)       --Will follow patient during hospitalization--  Wyn Quaker RN, MSN, CDE Diabetes Coordinator Inpatient Glycemic Control Team Team Pager: [phone] (8a-5p)

## 2017-11-19 NOTE — Progress Notes (Signed)
PT Cancellation Note  Patient Details Name: Ian Bond MRN: [id number] DOB: 03/08/1976   Cancelled Treatment:    Reason Eval/Treat Not Completed: Medical issues which prohibited therapy - attempted to see pt in am but postop shoe did not fit.  New shoes delivered but pt sleeping soundly and unable to complete assessment.  Did spend 15 min in am discussing with pt and family orders for transfers only.  Will need wheelchair, bedside commode and likely a rolling walker for home until pt is able to upgrade activity limitations.  Will return next date to complete eval as able.   [place] [facility] 11/19/2017, 3:27 PM

## 2017-11-20 ENCOUNTER — Telehealth: Payer: Self-pay

## 2017-11-20 LAB — BASIC METABOLIC PANEL
ANION GAP: 9 (ref 5–15)
BUN: 8 mg/dL (ref 6–20)
CHLORIDE: 101 mmol/L (ref 101–111)
CO2: 24 mmol/L (ref 22–32)
Calcium: 8.7 mg/dL — ABNORMAL LOW (ref 8.9–10.3)
Creatinine, Ser: 0.95 mg/dL (ref 0.61–1.24)
GFR calc non Af Amer: >60 mL/min (ref >60)
GLUCOSE: 244 mg/dL — AB (ref 65–99)
Potassium: 3.4 mmol/L — ABNORMAL LOW (ref 3.5–5.1)
Sodium: 134 mmol/L — ABNORMAL LOW (ref 135–145)

## 2017-11-20 LAB — GLUCOSE, CAPILLARY
Glucose-Capillary: 267 mg/dL — ABNORMAL HIGH (ref 65–99)
Glucose-Capillary: 252 mg/dL — ABNORMAL HIGH (ref 65–99)
Glucose-Capillary: 330 mg/dL — ABNORMAL HIGH (ref 65–99)
Glucose-Capillary: 185 mg/dL — ABNORMAL HIGH (ref 65–99)

## 2017-11-20 LAB — GASTROINTESTINAL PANEL BY PCR, STOOL (REPLACES STOOL CULTURE)
Adenovirus F40/41: NOT DETECTED
Astrovirus: NOT DETECTED
CYCLOSPORA CAYETANENSIS: NOT DETECTED
Campylobacter species: NOT DETECTED
Cryptosporidium: NOT DETECTED
ENTAMOEBA HISTOLYTICA: NOT DETECTED
Enteroaggregative E coli (EAEC): DETECTED — AB
Enteropathogenic E coli (EPEC): NOT DETECTED
Enterotoxigenic E coli (ETEC): NOT DETECTED
Giardia lamblia: NOT DETECTED
Norovirus GI/GII: NOT DETECTED
Plesimonas shigelloides: NOT DETECTED
Rotavirus A: NOT DETECTED
SALMONELLA SPECIES: NOT DETECTED
SAPOVIRUS (I, II, IV, AND V): NOT DETECTED
SHIGELLA/ENTEROINVASIVE E COLI (EIEC): NOT DETECTED
Shiga like toxin producing E coli (STEC): NOT DETECTED
VIBRIO CHOLERAE: NOT DETECTED
Vibrio species: NOT DETECTED
YERSINIA ENTEROCOLITICA: NOT DETECTED

## 2017-11-20 LAB — CBC
HCT: 26.3 % — ABNORMAL LOW (ref 39.0–52.0)
HEMOGLOBIN: 9.1 g/dL — AB (ref 13.0–17.0)
MCH: 28.8 pg (ref 26.0–34.0)
MCHC: 34.6 g/dL (ref 30.0–36.0)
MCV: 83.2 fL (ref 78.0–100.0)
Platelets: 441 10*3/uL — ABNORMAL HIGH (ref 150–400)
RBC: 3.16 MIL/uL — ABNORMAL LOW (ref 4.22–5.81)
RDW: 12.2 % (ref 11.5–15.5)
WBC: 23.8 10*3/uL — AB (ref 4.0–10.5)

## 2017-11-20 LAB — C DIFFICILE QUICK SCREEN W PCR REFLEX
C DIFFICLE (CDIFF) ANTIGEN: NEGATIVE
C Diff interpretation: NOT DETECTED
C Diff toxin: NEGATIVE

## 2017-11-20 MED ORDER — METOPROLOL TARTRATE 12.5 MG HALF TABLET
12.5000 mg | ORAL_TABLET | Freq: Two times a day (BID) | ORAL | Status: DC
  Administered 2017-11-20 – 2017-11-27 (×14): 12.5 mg via ORAL
  Filled 2017-11-20 (×14): qty 1

## 2017-11-20 MED ORDER — CIPROFLOXACIN HCL 500 MG PO TABS
500.0000 mg | ORAL_TABLET | Freq: Two times a day (BID) | ORAL | Status: DC
  Administered 2017-11-20 – 2017-11-22 (×4): 500 mg via ORAL
  Filled 2017-11-20 (×4): qty 1

## 2017-11-20 MED ORDER — POTASSIUM CHLORIDE CRYS ER 20 MEQ PO TBCR
40.0000 meq | EXTENDED_RELEASE_TABLET | Freq: Once | ORAL | Status: AC
  Administered 2017-11-20: 40 meq via ORAL
  Filled 2017-11-20: qty 2

## 2017-11-20 MED ORDER — SODIUM CHLORIDE 0.9 % IV SOLN
INTRAVENOUS | Status: DC
  Administered 2017-11-20 – 2017-11-23 (×4): via INTRAVENOUS

## 2017-11-20 NOTE — Progress Notes (Signed)
Spoke with patient about his diabetes.  States this is all new to him; he seemed to be in some pain while I was talking to him, but he responded to my questions.  His fiancee was in the room.  She states that she has diabetes and takes insulin. She will be willing to help him with his care. Talked with him about his A1C of 12.2%, normal blood sugar, checking his blood sugars at home, and needing a physician to follow him at discharge. Stated that someone had spoken to him about getting a PCP. He would be willing to take insulin especially with the help of his fiancee. He will be needing a wheelchair for home care, as well. Dietician has spoken with him, has Living Well with Diabetes booklet and insulin starter kit.  Will continue to monitor blood sugars while in the hospital.  Harvel Ricks RN BSN CDE Diabetes Coordinator Pager: [phone]  8am-5pm

## 2017-11-20 NOTE — Progress Notes (Signed)
Patient ID: Ian Bond, male   DOB: April 30, 1976, 42 y.o.   MRN: [id number] Postoperative day 2 bilateral transmetatarsal amputations.  Patient with incontinence.  Patient may be discharged once his GI work-up is complete.  Patient will discharge to home with a wheelchair transfers only.  I will follow-up in the office in 1 week.

## 2017-11-20 NOTE — Telephone Encounter (Signed)
Call received from [facility] with [facility]. She said that she may have a ramp that can be loaned to the patient.  She will also check to see if she has any resources for providing ramps for patients without an income.  Update provided to Jacqlyn Krauss, RN CM

## 2017-11-20 NOTE — Evaluation (Signed)
Physical Therapy Evaluation Patient Details Name: Ian Bond MRN: [id number] DOB: November 14, 1975 Today's Date: 11/20/2017   History of Present Illness  42yo obese M who has not sought medical care in years, and was admitted with nausea/vomiting and shortness of breath.  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 Exam revealed foul-smelling necrotic toes on both feet.  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  Clinical Impression  Pt admitted with above diagnosis. Pt currently with functional limitations due to the deficits listed below (see PT Problem List). Pt was able to transfer to recliner with RW with min  assist  And cues for sequencing steps and RW.  Good safety overall.  Will have assist  At home.  Pt does have 5 steps.  He rents therefore cannot get a ramp built.  Discussed portable rentable ramp but pt states he does not have the funds for this. Pt will either have to have ambulance transport or will have to walk up the stairs to get into home.  MD: Can pt weight bear to perform up 5 steps to enter home?  Pt will benefit from skilled PT to increase their independence and safety with mobility to allow discharge to the venue listed below.      Follow Up Recommendations Home health PT;Supervision/Assistance - 24 hour    Equipment Recommendations  Wheelchair (18x18 lightweight with desk arms, anti-tippers, extra length elevating legrests);Wheelchair cushion (18x18 pressure relieving);3in1 (PT);Other (comment);Rolling walker with 5" wheels    Recommendations for Other Services       Precautions / Restrictions Precautions Precautions: Fall Required Braces or Orthoses: Other Brace/Splint Other Brace/Splint: bil post op shoes Restrictions Weight Bearing Restrictions: Yes RLE Weight Bearing: Weight bearing as tolerated LLE Weight Bearing: Weight bearing as tolerated Other Position/Activity Restrictions: for transfers to wheelchair only      Mobility  Bed  Mobility Overal bed mobility: Independent                Transfers Overall transfer level: Needs assistance Equipment used: Rolling walker (2 wheeled) Transfers: Sit to/from [facility] Sit to Stand: Min assist;+2 safety/equipment Stand pivot transfers: Min assist;+2 physical assistance       General transfer comment: Assist to power up and steady once up.  Assist to guide RW and cues for sequencing steps and RW for pivot.   Ambulation/Gait             General Gait Details: Order for no ambulation.   Stairs            Wheelchair Mobility    Modified Rankin (Stroke Patients Only)       Balance Overall balance assessment: Needs assistance Sitting-balance support: No upper extremity supported;Feet supported Sitting balance-Leahy Scale: Good     Standing balance support: Bilateral upper extremity supported;During functional activity Standing balance-Leahy Scale: Poor Standing balance comment: Pt relies on UE and external support in standing for balance.                             Pertinent Vitals/Pain Pain Assessment: Faces Faces Pain Scale: Hurts even more Pain Location: feet Pain Descriptors / Indicators: Aching;Grimacing;Guarding Pain Intervention(s): Limited activity within patient's tolerance;Monitored during session;Premedicated before session;Repositioned  VSS on RA.   Home Living Family/patient expects to be discharged to:: Private residence Living Arrangements: Spouse/significant other;Children Available Help at Discharge: Family;Available 24 hours/day Type of Home: House Home Access: Stairs to enter Entrance  Stairs-Rails: Left Entrance Stairs-Number of Steps: 5 Home Layout: One level Home Equipment: None      Prior Function Level of Independence: Independent               Hand Dominance        Extremity/Trunk Assessment   Upper Extremity Assessment Upper Extremity Assessment: Defer to OT  evaluation    Lower Extremity Assessment Lower Extremity Assessment: Generalized weakness       Communication   Communication: No difficulties  Cognition Arousal/Alertness: Awake/alert Behavior During Therapy: WFL for tasks assessed/performed Overall Cognitive Status: Within Functional Limits for tasks assessed                                        General Comments      Exercises     Assessment/Plan    PT Assessment Patient needs continued PT services  PT Problem List Decreased strength;Decreased activity tolerance;Decreased balance;Decreased mobility;Decreased knowledge of use of DME;Decreased safety awareness;Decreased knowledge of precautions;Pain       PT Treatment Interventions DME instruction;Gait training;Functional mobility training;Therapeutic activities;Therapeutic exercise;Balance training;Patient/family education;Stair training    PT Goals (Current goals can be found in the Care Plan section)  Acute Rehab PT Goals Patient Stated Goal: to go home PT Goal Formulation: With patient Time For Goal Achievement: 12/04/17 Potential to Achieve Goals: Good    Frequency Min 3X/week   Barriers to discharge        Co-evaluation               AM-PAC PT "6 Clicks" Daily Activity  Outcome Measure Difficulty turning over in bed (including adjusting bedclothes, sheets and blankets)?: None Difficulty moving from lying on back to sitting on the side of the bed? : None Difficulty sitting down on and standing up from a chair with arms (e.g., wheelchair, bedside commode, etc,.)?: A Lot Help needed moving to and from a bed to chair (including a wheelchair)?: A Lot Help needed walking in hospital room?: Total Help needed climbing 3-5 steps with a railing? : Total 6 Click Score: 14    End of Session Equipment Utilized During Treatment: Gait belt Activity Tolerance: Patient limited by fatigue;Patient limited by pain Patient left: in chair;with call  bell/phone within reach Nurse Communication: Mobility status PT Visit Diagnosis: Unsteadiness on feet (R26.81);Muscle weakness (generalized) (M62.81);Pain Pain - Right/Left: (bil) Pain - part of body: Ankle and joints of foot    Time: 1102-1130 PT Time Calculation (min) (ACUTE ONLY): 28 min   Charges:   PT Evaluation $PT Eval Moderate Complexity: 1 Mod PT Treatments $Therapeutic Activity: 8-22 mins   PT G Codes:        Nenzel Raford Brissett,PT Acute Rehabilitation [phone] (pager)   Denice Paradise 11/20/2017, 1:34 PM

## 2017-11-20 NOTE — Progress Notes (Signed)
Came in to check on my patient and found a large puddle of liquid at the bottom of the bed, upon further investigation it was determined that he was incontinent of large amount of urine. I was spoke with MD earlier RE: pt's incontinence of liquid stool and was able to get an order for a flexiseal, explained to patient that I would place a condom catheter and the flexiseal then clean him and change the linens, pt was agreeable but when placing the flexiseal he got very embarressed and became very tense. I asked if he was having pain and he stated Yes, so when I finished the procedure, I got him some pain medication, will continue to monitor

## 2017-11-20 NOTE — Telephone Encounter (Signed)
Call received from [facility] at the [facility] # [phone] stating that she was able to find a ramp that the patient can use temporarily until a a permanent ramp is obtained.  She said that she will need the measurements of the area where the ramp will go to make sure that it will fit. She stated that they do not do the measurements and is inquiring who might be able to assist with this. Informed her that the inpatient CM would be notified.

## 2017-11-20 NOTE — Telephone Encounter (Signed)
Message received from Jacqlyn Krauss, RN CM requesting a hospital follow up appointment for the patient at [facility]. An appointment was scheduled for 11/27/17 @ 1010 and the information was placed on the AVS. She was also inquiring about assistance for the patient with obtaining a ramp for his mobile home as he has no insurance.   Call placed to [facility] for [place] # [phone]. Spoke to [employer] who stated that the person who knows about ramps was not in today. She Neoma Laming) would check with her tomorrow and call this CM back.   Update provided to B. Adelfa Koh, RN CM and also informed her that [facility] may be able to assist with the ramp.

## 2017-11-20 NOTE — Progress Notes (Signed)
Ian Bond  MWN:[id number] DOB: 07/25/1976 DOA: 11/16/2017 PCP: Patient, No Pcp Per    Brief Narrative:  42yo obese M who has not sought medical care in years, and was admitted with nausea/vomiting and shortness of breath.  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  Exam revealed foul-smelling necrotic toes on both feet  Significant Events: 4/25 admit - DKA - diabetic foot wounds 4/27 transmetatarsal amputation B LE   Subjective: He is doing ok, abdomen less distended. Had multiples episode of incontinence last night.  Had flexi   Assessment & Plan:  DKA - Uncontrolled Newly diagnosed DM2 A1c 12.2 - DKA resolved - Continue with  lantus to 35 units at HS.  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 Continue with IV antibiotics.   Diarrhea;  Check for c diff, due to worsening leukocytosis, diarrhea and abdominal distension.   Mild hypokalemia  resollved  Acute kidney injury Resolved   Etoh abuse  3-4 beers per day -  No evidence of withdrawal.   Obesity - Body mass index is 32.32 kg/m.   DVT prophylaxis: SQ heparin  Code Status: FULL CODE Family Communication: discussed with patient.  Disposition Plan:   Consultants:  PCCM Ortopedics   Antimicrobials:  Vanc 4/25 > Zosyn 4/25 >  Objective: Blood pressure (!) 135/98, pulse 94, temperature 98.1 F (36.7 C), temperature source Oral, resp. rate 16, height 6' 3" (1.905 m), weight 117.3 kg (258 lb 9.6 oz), SpO2 99 %.  Intake/Output Summary (Last 24 hours) at 11/20/2017 0819 Last data filed at 11/20/2017 0438 Gross per 24 hour  Intake 1430 ml  Output 1150 ml  Net 280 ml   Filed Weights   11/16/17 1343 11/19/17 0424 11/20/17 0549  Weight: 118 kg (260 lb 2.3 oz) 117.8 kg (259 lb 11.2 oz) 117.3 kg (258 lb 9.6 oz)    Examination: General: NAD Lungs:  CTA Cardiovascular: S 1, S 2 RRR Abdomen: BS present, soft, nt Extremities: BL LE with dressing   CBC: Recent Labs  Lab 11/18/17 0246 11/19/17 0421 11/20/17 0340  WBC 18.1* 21.4* 23.8*  HGB 11.4* 9.3* 9.1*  HCT 32.1* 26.9* 26.3*  MCV 80.7 82.0 83.2  PLT 460* 391 403*   Basic Metabolic Panel: Recent Labs  Lab 11/16/17 1221 11/16/17 1529  11/17/17 0313  11/18/17 0246 11/19/17 0421 11/20/17 0340  NA 131* 133*   < > 138   < > 136 135 134*  K 4.9 4.3   < > 3.9   < > 3.6 3.7 3.4*  CL 95* 103   < > 108   < > 107 99* 101  CO2 7* 14*   < > 18*   < > 20* 24 24  GLUCOSE 585* 297*   < > 112*   < > 358* 307* 244*  BUN 49* 43*   < > 28*   < > _0 CREATININE 2.35* 1.73*   < > 0.96   < > 0.98 1.04 0.95  CALCIUM 9.1 8.9   < > 9.3   < > 9.7 8.9 8.7*  PHOS 4.2 1.6*  --  2.1*  --   --   --   --    < > = values in this interval not displayed.   GFR: Estimated Creatinine Clearance:  138.4 mL/min (by C-G formula based on SCr of 0.95 mg/dL).  Liver Function Tests: Recent Labs  Lab 11/16/17 0825 11/18/17 0246 11/19/17 0421  AST _0 ALT 18 16* 15*  ALKPHOS 171* 102 96  BILITOT 1.9* 0.7 0.8  PROT 9.2* 7.1 6.5  ALBUMIN 2.6* 2.0* 1.7*   Recent Labs  Lab 11/16/17 0825  LIPASE 77*      HbA1C: Hgb A1c MFr Bld  Date/Time Value Ref Range Status  11/17/2017 03:13 AM 12.2 (H) 4.8 - 5.6 % Final    Comment:    (NOTE) Pre diabetes:          5.7%-6.4% Diabetes:              >6.4% Glycemic control for   <7.0% adults with diabetes     CBG: Recent Labs  Lab 11/19/17 0757 11/19/17 1242 11/19/17 1545 11/19/17 2109 11/20/17 0733  GLUCAP 299* 300* 369* 255* 267*    Recent Results (from the past 240 hour(s))  MRSA PCR Screening     Status: None   Collection Time: 11/16/17  1:51 PM  Result Value Ref Range Status   MRSA by PCR NEGATIVE NEGATIVE Final    Comment:        The GeneXpert MRSA Assay (FDA approved for NASAL specimens only), is one component of  a comprehensive MRSA colonization surveillance program. It is not intended to diagnose MRSA infection nor to guide or monitor treatment for MRSA infections. Performed at [facility], [place] [address] [place], [place] [address]   Culture, blood (routine x 2)     Status: None (Preliminary result)   Collection Time: 11/16/17  1:55 PM  Result Value Ref Range Status   Specimen Description BLOOD HAND  Final   Special Requests   Final    BOTTLES DRAWN AEROBIC AND ANAEROBIC Blood Culture results may not be optimal due to an inadequate volume of blood received in culture bottles   Culture   Final    NO GROWTH 3 DAYS Performed at [facility], [place] [address] [place], [place] [address]    Report Status PENDING  Incomplete  Culture, blood (routine x 2)     Status: None (Preliminary result)   Collection Time: 11/16/17  2:00 PM  Result Value Ref Range Status   Specimen Description BLOOD LEFT HAND  Final   Special Requests   Final    BOTTLES DRAWN AEROBIC ONLY Blood Culture adequate volume   Culture   Final    NO GROWTH 3 DAYS Performed at [facility], [place] [address], [place], [place] [address]    Report Status PENDING  Incomplete  Surgical pcr screen     Status: None   Collection Time: 11/18/17 12:00 AM  Result Value Ref Range Status   MRSA, PCR NEGATIVE NEGATIVE Final   Staphylococcus aureus NEGATIVE NEGATIVE Final    Comment: (NOTE) The Xpert SA Assay (FDA approved for NASAL specimens in patients 48 years of age and older), is one component of a comprehensive surveillance program. It is not intended to diagnose infection nor to guide or monitor treatment. Performed at [facility], [place] [address] [place], [place] [address]      Scheduled Meds: . chlorhexidine  15 mL Mouth Rinse BID  . feeding supplement (GLUCERNA SHAKE)  237 mL Oral BID BM  . gabapentin  300 mg Oral TID  . heparin  5,000 Units Subcutaneous Q8H  . insulin aspart  0-15 Units  Subcutaneous TID WC  . insulin aspart  0-5 Units Subcutaneous QHS  . insulin aspart  4 Units Subcutaneous TID WC  . insulin glargine  35 Units Subcutaneous Q24H  . insulin starter kit- syringes  1 kit Other Once  . living well with diabetes book   Does not apply Once  . mouth rinse  15 mL Mouth Rinse q12n4p  . metoprolol tartrate  5 mg Intravenous Q8H  . saccharomyces boulardii  250 mg Oral BID     LOS: 4 days   Louisa, Md [phone]  If 7PM-7AM, please contact night-coverage [email or web address] Password [facility] 11/20/2017, 8:19 AM

## 2017-11-20 NOTE — Clinical Social Work Note (Signed)
Clinical Social Work Assessment  Patient Details  Name: Ian Bond MRN: [id number] Date of Birth: [birth year]  Date of referral:  11/20/17               Reason for consult:  Facility Placement, Discharge Planning                Permission sought to share information with:  Facility Sport and exercise psychologist, Family Supports Permission granted to share information::  Yes, Verbal Permission Granted  Name::     Elvina Mattes  Agency::  SNFs  Relationship::  significant other  Contact Information:  [phone]  Housing/Transportation Living arrangements for the past 2 months:  Mobile Home Source of Information:  Patient, Partner Patient Interpreter Needed:  None Criminal Activity/Legal Involvement Pertinent to Current Situation/Hospitalization:  No - Comment as needed Significant Relationships:  Significant Other Lives with:  Significant Other Do you feel safe going back to the place where you live?  Yes Need for family participation in patient care:  Yes (Comment)  Care giving concerns: Patient from home with fiance. Toe amputation this admission, PT recommending home health, but patient unable to manage at home. Patient does not have insurance.   Social Worker assessment / plan: CSW met with patient and fiance, Ian Bond, at bedside. Patient alert and oriented. CSW discussed PT recommendations. Patient and Ian Bond confirmed they cannot manage patient at home in patient's current condition. Ian Bond unable to lift patient. They rent their mobile home and cannot alter it with a wheelchair ramp. Patient and fiance agreeable to short term SNF. CSW explained that patient would go to SNF under LOG while patient's Medicaid pending.  CSW discussed with CSW clinical supervisor- 2 week LOG is approved for SNF. CSW sent out initial referrals and now awaiting bed offers. CSW to follow and support with disposition planning.  Employment status:  [employer] information:  Self Pay (Medicaid  Pending) PT Recommendations:  [place] / Referral to community resources:  [place]  Patient/Family's Response to care: Patient and fiance appreciative of care.  Patient/Family's Understanding of and Emotional Response to Diagnosis, Current Treatment, and Prognosis: Patient and fiance understanding of patient's condition and unable to manage at home. They are agreeable to SNF.  Emotional Assessment Appearance:  Appears stated age Attitude/Demeanor/Rapport:  Engaged Affect (typically observed):  Calm, Accepting, Pleasant Orientation:  Oriented to Self, Oriented to Place, Oriented to  Time, Oriented to Situation Alcohol / Substance use:  Not Applicable Psych involvement (Current and /or in the community):  No (Comment)  Discharge Needs  Concerns to be addressed:  Discharge Planning Concerns, Care Coordination Readmission within the last 30 days:  No Current discharge risk:  Physical Impairment, Dependent with Mobility Barriers to Discharge:  Continued Medical Work up, Inadequate or no insurance   Estanislado Emms, LCSW 11/20/2017, 4:30 PM

## 2017-11-20 NOTE — NC FL2 (Signed)
[place] MEDICAID FL2 LEVEL OF CARE SCREENING TOOL     IDENTIFICATION  Patient Name: Ian Bond Birthdate: Dec 14, 1975 Sex: male Admission Date (Current Location): 11/16/2017  [facility] and [place] Number:  Herbalist and Address:  [facility], [place] [address], [place], [place] [address]      Provider Number: [id number]  Attending Physician Name and Address:  Elmarie Shiley, MD  Relative Name and Phone Number:  Elvina Mattes, [phone]    Current Level of Care: Hospital Recommended Level of Care: [place] Prior Approval Number:    Date Approved/Denied:   PASRR Number: [id number]  Discharge Plan: SNF    Current Diagnoses: Patient Active Problem List   Diagnosis Date Noted  . Gangrene of left foot ([place])   . Gangrene of right foot ([place])   . DKA (diabetic ketoacidoses) ([place]) 11/16/2017  . Diabetic infection of left foot ([place]) 11/16/2017  . Diabetic infection of right foot ([place]) 11/16/2017    Orientation RESPIRATION BLADDER Height & Weight     Self, Time, Situation, Place  Normal Incontinent Weight: 258 lb 9.6 oz (117.3 kg) Height:  '6\' 3"'  (190.5 cm)  BEHAVIORAL SYMPTOMS/MOOD NEUROLOGICAL BOWEL NUTRITION STATUS      Incontinent Diet(please see DC summary)  AMBULATORY STATUS COMMUNICATION OF NEEDS Skin   Extensive Assist Verbally Surgical wounds(R and L feet closed incisions - trans metatarsal amputations)                       Personal Care Assistance Level of Assistance  Bathing, Feeding, Dressing Bathing Assistance: Limited assistance Feeding assistance: Independent Dressing Assistance: Limited assistance     Functional Limitations Info  Sight, Hearing, Speech Sight Info: Adequate Hearing Info: Adequate Speech Info: Adequate    SPECIAL CARE FACTORS FREQUENCY  PT (By licensed PT)     PT Frequency: 5x/week              Contractures Contractures Info: Not present    Additional  Factors Info  Code Status, Allergies, Isolation Precautions, Insulin Sliding Scale Code Status Info: Full Allergies Info: No Known Allergies   Insulin Sliding Scale Info: novolog 3x/day with meals and at bedtime; lantus daily Isolation Precautions Info: enteric precautions     Current Medications (11/20/2017):  This is the current hospital active medication list Current Facility-Administered Medications  Medication Dose Route Frequency Provider Last Rate Last Dose  . 0.9 %  sodium chloride infusion   Intravenous Continuous Regalado, Belkys A, MD 75 mL/hr at 11/20/17 0831    . acetaminophen (TYLENOL) tablet 325-650 mg  325-650 mg Oral Q6H PRN Newt Minion, MD      . bisacodyl (DULCOLAX) suppository 10 mg  10 mg Rectal Daily PRN Newt Minion, MD      . chlorhexidine (PERIDEX) 0.12 % solution 15 mL  15 mL Mouth Rinse BID Newt Minion, MD   15 mL at 11/20/17 7116  . chlorproMAZINE (THORAZINE) tablet 10 mg  10 mg Oral TID PRN Newt Minion, MD   10 mg at 11/17/17 1710  . feeding supplement (GLUCERNA SHAKE) (GLUCERNA SHAKE) liquid 237 mL  237 mL Oral BID BM Newt Minion, MD   237 mL at 11/20/17 1440  . gabapentin (NEURONTIN) capsule 300 mg  300 mg Oral TID Newt Minion, MD   300 mg at 11/20/17 0815  . heparin injection 5,000 Units  5,000 Units Subcutaneous Q8H Newt Minion, MD  5,000 Units at 11/20/17 1440  . hydrALAZINE (APRESOLINE) injection 10 mg  10 mg Intravenous Q4H PRN Newt Minion, MD      . HYDROmorphone (DILAUDID) injection 0.5-1 mg  0.5-1 mg Intravenous Q4H PRN Newt Minion, MD   1 mg at 11/20/17 0644  . insulin aspart (novoLOG) injection 0-15 Units  0-15 Units Subcutaneous TID WC Cherene Altes, MD   8 Units at 11/20/17 1238  . insulin aspart (novoLOG) injection 0-5 Units  0-5 Units Subcutaneous QHS Cherene Altes, MD   3 Units at 11/19/17 2209  . insulin aspart (novoLOG) injection 4 Units  4 Units Subcutaneous TID WC Regalado, Belkys A, MD   4 Units at 11/20/17  1238  . insulin glargine (LANTUS) injection 35 Units  35 Units Subcutaneous Q24H Regalado, Belkys A, MD   35 Units at 11/19/17 2210  . insulin starter kit- syringes (English) 1 kit  1 kit Other Once Regalado, Belkys A, MD      . magnesium citrate solution 1 Bottle  1 Bottle Oral Once PRN Newt Minion, MD      . MEDLINE mouth rinse  15 mL Mouth Rinse q12n4p Newt Minion, MD   15 mL at 11/20/17 1239  . methocarbamol (ROBAXIN) tablet 500 mg  500 mg Oral Q6H PRN Newt Minion, MD   500 mg at 11/20/17 0522   Or  . methocarbamol (ROBAXIN) 500 mg in dextrose 5 % 50 mL IVPB  500 mg Intravenous Q6H PRN Newt Minion, MD      . metoprolol tartrate (LOPRESSOR) injection 5 mg  5 mg Intravenous Q8H Newt Minion, MD   5 mg at 11/20/17 1436  . ondansetron (ZOFRAN) tablet 4 mg  4 mg Oral Q6H PRN Newt Minion, MD       Or  . ondansetron [facility]) injection 4 mg  4 mg Intravenous Q6H PRN Newt Minion, MD   4 mg at 11/18/17 1706  . oxyCODONE (Oxy IR/ROXICODONE) immediate release tablet 10-15 mg  10-15 mg Oral Q4H PRN Newt Minion, MD   10 mg at 11/19/17 1606  . oxyCODONE (Oxy IR/ROXICODONE) immediate release tablet 5-10 mg  5-10 mg Oral Q4H PRN Newt Minion, MD   5 mg at 11/20/17 1453  . piperacillin-tazobactam (ZOSYN) IVPB 3.375 g  3.375 g Intravenous Q8H Newt Minion, MD 12.5 mL/hr at 11/20/17 1434 3.375 g at 11/20/17 1434  . polyethylene glycol (MIRALAX / GLYCOLAX) packet 17 g  17 g Oral Daily PRN Newt Minion, MD      . saccharomyces boulardii (FLORASTOR) capsule 250 mg  250 mg Oral BID Regalado, Belkys A, MD   250 mg at 11/20/17 0813  . vancomycin (VANCOCIN) IVPB 750 mg/150 ml premix  750 mg Intravenous Q8H Lavenia Atlas, [facility]   Stopped at 11/20/17 1434     Discharge Medications: Please see discharge summary for a list of discharge medications.  Relevant Imaging Results:  Relevant Lab Results:   Additional Information SSN: [id number]  Estanislado Emms, LCSW

## 2017-11-20 NOTE — Progress Notes (Addendum)
Nutrition Follow-up / Consult  DOCUMENTATION CODES:   Obesity unspecified  INTERVENTION:    Continue Glucerna Shake PO BID, each supplement provides 220 kcal and 10 grams of protein  Diabetes diet education provided  NUTRITION DIAGNOSIS:   Increased nutrient needs related to wound healing as evidenced by estimated needs.  Ongoing  GOAL:   Patient will meet greater than or equal to 90% of their needs  Progressing  MONITOR:   PO intake, Supplement acceptance, Labs, I & O's, Skin, Weight trends  REASON FOR ASSESSMENT:   Consult Diet education  ASSESSMENT:   42 year old male brought to ED by EMA for evaluation of generalized weakness and was found to be in DKA with infections on both feet. Pt has not followed up regularly with a physician for 20 years. PMH significant for diabetes mellitus, EtOH abuse, and newly diagnosed hypertension.  RD consulted for nutrition education regarding diabetes.   Lab Results  Component Value Date   HGBA1C 12.2 (H) 11/17/2017    RD provided "Carbohydrate Counting for People with Diabetes" handout from the Academy of Nutrition and Dietetics. Discussed different food groups and their effects on blood sugar, emphasizing carbohydrate-containing foods. Provided list of carbohydrates and recommended serving sizes of common foods.  Discussed importance of controlled and consistent carbohydrate intake throughout the day. Provided examples of ways to balance meals/snacks and encouraged intake of high-fiber, whole grain complex carbohydrates. Teach back method used.  Expect fair compliance. Further reinforcement is recommended.   S/P bilateral transmetatarsal amputations on 4/27. Incontinence issues last night. Patient reports appetite and intake are improving. He is consuming ~50% of meals. He is receiving and drinking Glucerna Shakes BID.  Labs and medications reviewed.   Diet Order:  Diet Carb Modified Fluid consistency: Thin; Room service  appropriate? Yes  EDUCATION NEEDS:   Education needs have been addressed(provided DM diet education)  Skin:  Skin Assessment: Skin Integrity Issues: Skin Integrity Issues:: Diabetic Ulcer Diabetic Ulcer: bilateral feet/toes  Last BM:  4/29  Height:   Ht Readings from Last 1 Encounters:  11/16/17 6\' 3"  (1.905 m)    Weight:   Wt Readings from Last 1 Encounters:  11/20/17 258 lb 9.6 oz (117.3 kg)    Ideal Body Weight:  89.1 kg  BMI:  Body mass index is 32.32 kg/m.  Obesity, class 1  Estimated Nutritional Needs:   Kcal:  2400-2600 kcal/day  Protein:  125-140 grams/day  Fluid:  >/= 2.4 L/day    Molli Barrows, RD, LDN, [place] Pager [phone] After Hours Pager [phone]

## 2017-11-21 LAB — BASIC METABOLIC PANEL
ANION GAP: 8 (ref 5–15)
BUN: 5 mg/dL — ABNORMAL LOW (ref 6–20)
CO2: 24 mmol/L (ref 22–32)
Calcium: 8.6 mg/dL — ABNORMAL LOW (ref 8.9–10.3)
Chloride: 100 mmol/L — ABNORMAL LOW (ref 101–111)
Creatinine, Ser: 0.86 mg/dL (ref 0.61–1.24)
GLUCOSE: 296 mg/dL — AB (ref 65–99)
POTASSIUM: 3.1 mmol/L — AB (ref 3.5–5.1)
Sodium: 132 mmol/L — ABNORMAL LOW (ref 135–145)

## 2017-11-21 LAB — CBC
HEMATOCRIT: 28.3 % — AB (ref 39.0–52.0)
Hemoglobin: 9.6 g/dL — ABNORMAL LOW (ref 13.0–17.0)
MCH: 28.7 pg (ref 26.0–34.0)
MCHC: 33.9 g/dL (ref 30.0–36.0)
MCV: 84.7 fL (ref 78.0–100.0)
Platelets: 504 10*3/uL — ABNORMAL HIGH (ref 150–400)
RBC: 3.34 MIL/uL — AB (ref 4.22–5.81)
RDW: 12.8 % (ref 11.5–15.5)
WBC: 25.2 10*3/uL — AB (ref 4.0–10.5)

## 2017-11-21 LAB — GLUCOSE, CAPILLARY
GLUCOSE-CAPILLARY: 263 mg/dL — AB (ref 65–99)
GLUCOSE-CAPILLARY: 176 mg/dL — AB (ref 65–99)
Glucose-Capillary: 336 mg/dL — ABNORMAL HIGH (ref 65–99)
Glucose-Capillary: 129 mg/dL — ABNORMAL HIGH (ref 65–99)

## 2017-11-21 LAB — CULTURE, BLOOD (ROUTINE X 2)
CULTURE: NO GROWTH
Culture: NO GROWTH
Special Requests: ADEQUATE

## 2017-11-21 MED ORDER — SODIUM CHLORIDE 0.9 % IV BOLUS: 500.0000 mL | Freq: Once | INTRAVENOUS | Status: DC

## 2017-11-21 MED ORDER — INSULIN GLARGINE 100 UNIT/ML ~~LOC~~ SOLN
37.0000 [IU] | SUBCUTANEOUS | Status: DC
  Administered 2017-11-21: 37 [IU] via SUBCUTANEOUS
  Filled 2017-11-21: qty 0.37

## 2017-11-21 MED ORDER — POTASSIUM CHLORIDE CRYS ER 20 MEQ PO TBCR
40.0000 meq | EXTENDED_RELEASE_TABLET | Freq: Once | ORAL | Status: AC
Start: 2017-11-21 — End: 2017-11-21
  Administered 2017-11-21: 40 meq via ORAL
  Filled 2017-11-21: qty 2

## 2017-11-21 NOTE — Progress Notes (Addendum)
Ian Bond  ZJQ:[id number] DOB: 30-Jan-1976 DOA: 11/16/2017 PCP: Patient, No Pcp Per    Brief Narrative:  42yo obese M who has not sought medical care in years, and was admitted with nausea/vomiting and shortness of breath.  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  Exam revealed foul-smelling necrotic toes on both feet  Significant Events: 4/25 admit - DKA - diabetic foot wounds 4/27 transmetatarsal amputation B LE   Subjective: He is worry that he has E coli infection.  Still with flexi seal , watery stool in the bag. Denies worsening abdominal pain.  No feet pain.   Assessment & Plan:  DKA - Uncontrolled Newly diagnosed DM2 A1c 12.2 - DKA resolved - Increase lantus to 37 units.  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 Dr Sharol Given will follow up on patient.    Diarrhea;  C diff negative.  GI pathogen positive for E coli.  Started ciprofloxacin.  WBC up today , cipro started yesterday. Repeat CBC in am.   Leukocytosis.  Might be related to GI illness. Discussed with Dr Sharol Given unlikely related post sx. He will follow up on patient. Ok to discontinue IV vanc and Zosyn.   Mild hypokalemia  resollved  Acute kidney injury Resolved   Etoh abuse  3-4 beers per day -  No evidence of withdrawal.   Addendum;  Hyponatremia; resolved.  Possible sepsis on admission; tachycardia, tachypnea, Leukocu=ytosis, tempeture 100.1. Source of infection foot wound. Treated with IV fluids and antibiotics.     Obesity - Body mass index is 32.76 kg/m.   DVT prophylaxis: SQ heparin  Code Status: FULL CODE Family Communication: discussed with patient.  Disposition Plan:   Consultants:  PCCM Ortopedics   Antimicrobials:  Vanc 4/25 > Zosyn 4/25 >  Objective: Blood pressure (!) 132/94, pulse 93, temperature 99.1 F (37.3  C), temperature source Oral, resp. rate 17, height 6\' 3"  (1.905 m), weight 118.9 kg (262 lb 2 oz), SpO2 99 %.  Intake/Output Summary (Last 24 hours) at 11/21/2017 1336 Last data filed at 11/21/2017 1121 Gross per 24 hour  Intake 2278 ml  Output 3050 ml  Net -772 ml   Filed Weights   11/19/17 0424 11/20/17 0549 11/21/17 0434  Weight: 117.8 kg (259 lb 11.2 oz) 117.3 kg (258 lb 9.6 oz) 118.9 kg (262 lb 2 oz)    Examination: General: NAD Lungs: CAT  Cardiovascular: S 1, S 2 RRR Abdomen: BS present, soft, nt Extremities: BL LE with dressing   CBC: Recent Labs  Lab 11/19/17 0421 11/20/17 0340 11/21/17 0942  WBC 21.4* 23.8* 25.2*  HGB 9.3* 9.1* 9.6*  HCT 26.9* 26.3* 28.3*  MCV 82.0 83.2 84.7  PLT 391 441* 240*   Basic Metabolic Panel: Recent Labs  Lab 11/16/17 1221 11/16/17 1529  11/17/17 0313  11/18/17 0246 11/19/17 0421 11/20/17 0340  NA 131* 133*   < > 138   < > 136 135 134*  K 4.9 4.3   < > 3.9   < > 3.6 3.7 3.4*  CL 95* 103   < > 108   < > 107 99* 101  CO2 7* 14*   < > 18*   < > 20* 24 24  GLUCOSE 585* 297*   < > 112*   < > 358* 307* 244*  BUN 49* 43*   < > 28*   < > 19 15 8   CREATININE 2.35* 1.73*   < > 0.96   < > 0.98 1.04 0.95  CALCIUM 9.1 8.9   < > 9.3   < > 9.7 8.9 8.7*  PHOS 4.2 1.6*  --  2.1*  --   --   --   --    < > = values in this interval not displayed.   GFR: Estimated Creatinine Clearance: 139.4 mL/min (by C-G formula based on SCr of 0.95 mg/dL).  Liver Function Tests: Recent Labs  Lab 11/16/17 0825 11/18/17 0246 11/19/17 0421  AST 23 22 17   ALT 18 16* 15*  ALKPHOS 171* 102 96  BILITOT 1.9* 0.7 0.8  PROT 9.2* 7.1 6.5  ALBUMIN 2.6* 2.0* 1.7*   Recent Labs  Lab 11/16/17 0825  LIPASE 77*      HbA1C: Hgb A1c MFr Bld  Date/Time Value Ref Range Status  11/17/2017 03:13 AM 12.2 (H) 4.8 - 5.6 % Final    Comment:    (NOTE) Pre diabetes:          5.7%-6.4% Diabetes:              >6.4% Glycemic control for   <7.0% adults with  diabetes     CBG: Recent Labs  Lab 11/20/17 1147 11/20/17 1542 11/20/17 2132 11/21/17 0721 11/21/17 1119  GLUCAP 252* 330* 185* 263* 336*    Recent Results (from the past 240 hour(s))  MRSA PCR Screening     Status: None   Collection Time: 11/16/17  1:51 PM  Result Value Ref Range Status   MRSA by PCR NEGATIVE NEGATIVE Final    Comment:        The GeneXpert MRSA Assay (FDA approved for NASAL specimens only), is one component of a comprehensive MRSA colonization surveillance program. It is not intended to diagnose MRSA infection nor to guide or monitor treatment for MRSA infections. Performed at [facility], [place] [address], [place], [place] [address]   Culture, blood (routine x 2)     Status: None   Collection Time: 11/16/17  1:55 PM  Result Value Ref Range Status   Specimen Description BLOOD HAND  Final   Special Requests   Final    BOTTLES DRAWN AEROBIC AND ANAEROBIC Blood Culture results may not be optimal due to an inadequate volume of blood received in culture bottles   Culture   Final    NO GROWTH 5 DAYS Performed at [facility], [place] [address], [place], [place] [address]    Report Status 11/21/2017 FINAL  Final  Culture, blood (routine x 2)     Status: None   Collection Time: 11/16/17  2:00 PM  Result Value Ref Range Status   Specimen Description BLOOD LEFT HAND  Final   Special Requests   Final    BOTTLES DRAWN AEROBIC ONLY Blood Culture adequate volume   Culture   Final    NO GROWTH 5 DAYS Performed at [facility], [place] [address], [place], [place] [address]    Report Status 11/21/2017 FINAL  Final  Surgical pcr screen     Status: None   Collection Time: 11/18/17 12:00 AM  Result Value Ref Range Status   MRSA, PCR NEGATIVE NEGATIVE Final   Staphylococcus aureus NEGATIVE NEGATIVE Final    Comment: (NOTE) The Xpert SA Assay (FDA approved for NASAL specimens in patients 31 years of age and  older), is one component of a  comprehensive surveillance program. It is not intended to diagnose infection nor to guide or monitor treatment. Performed at [facility], [place] [address], [place], [place] [address]   C difficile quick scan w PCR reflex     Status: None   Collection Time: 11/20/17 11:40 AM  Result Value Ref Range Status   C Diff antigen NEGATIVE NEGATIVE Final   C Diff toxin NEGATIVE NEGATIVE Final   C Diff interpretation No C. difficile detected.  Final  Gastrointestinal Panel by PCR , Stool     Status: Abnormal   Collection Time: 11/20/17 11:40 AM  Result Value Ref Range Status   Campylobacter species NOT DETECTED NOT DETECTED Final   Plesimonas shigelloides NOT DETECTED NOT DETECTED Final   Salmonella species NOT DETECTED NOT DETECTED Final   Yersinia enterocolitica NOT DETECTED NOT DETECTED Final   Vibrio species NOT DETECTED NOT DETECTED Final   Vibrio cholerae NOT DETECTED NOT DETECTED Final   Enteroaggregative E coli (EAEC) DETECTED (A) NOT DETECTED Final    Comment: RESULT CALLED TO, READ BACK BY AND VERIFIED WITH: MELYNN MUELLER AT 1850 11/20/2017.  TFK    Enteropathogenic E coli (EPEC) NOT DETECTED NOT DETECTED Final   Enterotoxigenic E coli (ETEC) NOT DETECTED NOT DETECTED Final   Shiga like toxin producing E coli (STEC) NOT DETECTED NOT DETECTED Final   Shigella/Enteroinvasive E coli (EIEC) NOT DETECTED NOT DETECTED Final   Cryptosporidium NOT DETECTED NOT DETECTED Final   Cyclospora cayetanensis NOT DETECTED NOT DETECTED Final   Entamoeba histolytica NOT DETECTED NOT DETECTED Final   Giardia lamblia NOT DETECTED NOT DETECTED Final   Adenovirus F40/41 NOT DETECTED NOT DETECTED Final   Astrovirus NOT DETECTED NOT DETECTED Final   Norovirus GI/GII NOT DETECTED NOT DETECTED Final   Rotavirus A NOT DETECTED NOT DETECTED Final   Sapovirus (I, II, IV, and V) NOT DETECTED NOT DETECTED Final    Comment: Performed at [facility], [place] [address] [place], [place] [address]       Scheduled Meds: . chlorhexidine  15 mL Mouth Rinse BID  . ciprofloxacin  500 mg Oral BID  . feeding supplement (GLUCERNA SHAKE)  237 mL Oral BID BM  . gabapentin  300 mg Oral TID  . heparin  5,000 Units Subcutaneous Q8H  . insulin aspart  0-15 Units Subcutaneous TID WC  . insulin aspart  0-5 Units Subcutaneous QHS  . insulin aspart  4 Units Subcutaneous TID WC  . insulin glargine  35 Units Subcutaneous Q24H  . mouth rinse  15 mL Mouth Rinse q12n4p  . metoprolol tartrate  12.5 mg Oral BID  . saccharomyces boulardii  250 mg Oral BID     LOS: 5 days   [facility] [place] [phone]  If 7PM-7AM, please contact night-coverage [email or web address] Password [email or web address] 11/21/2017, 1:36 PM

## 2017-11-21 NOTE — Progress Notes (Signed)
CSW clinical supervisor has approved 2 week room and board LOG for SNF and 2 week LOG for PT/OT, for SNF that contracts out for therapies. Patient is uninsured and Medicaid is pending. [facility] and [facility] accepted referral and will accept LOGs.  CSW met with patient at bedside with liaison for the two facilities and offered choice. Patient chose [facility].   CSW following for medical readiness and will support with discharge planning.  Estanislado Emms, [place]

## 2017-11-21 NOTE — Progress Notes (Signed)
Orthopedic Tech Progress Note Patient Details:  Ian Bond January 09, 1976 [id number]  Ortho Devices Type of Ortho Device: Postop shoe/boot Ortho Device/Splint Location: Physical therapy requested pt have larger post op shoes because post op shoes provided did not strap all the way around pt's feet As a result of the dressing.  Velco straps were added to current post up shoes so pt can transfer from bed to chair and chair to bed.  pt is not to ambulate in adjusted/modified post op shoes.  Pt will need custom/special order for proper shoes to ambulate in.   Bilateral. Ortho Device/Splint Interventions: Adjustment   Post Interventions Patient Tolerated: Well Instructions Provided: Adjustment of device, Care of device   Kristopher Oppenheim 11/21/2017, 12:44 PM

## 2017-11-21 NOTE — Progress Notes (Signed)
Inpatient Diabetes Program Recommendations  AACE/ADA: New Consensus Statement on Inpatient Glycemic Control (2015)  Target Ranges:  Prepandial:   less than 140 mg/dL      Peak postprandial:   less than 180 mg/dL (1-2 hours)      Critically ill patients:  140 - 180 mg/dL   Lab Results  Component Value Date   GLUCAP 336 (H) 11/21/2017   HGBA1C 12.2 (H) 11/17/2017    Review of Glycemic Control  Blood sugars still above goal. Needs insulin adjustments. Will likely d/c to Rehab.  Inpatient Diabetes Program Recommendations:     Increase Lantus to 40 units Q24H Increase Novolog to 6 units tidwc Increase Novolog to 0-20 units tidwc and hs  Will continue to follow.  Thank you. Lorenda Peck, RD, LDN, CDE Inpatient Diabetes Coordinator [phone]

## 2017-11-21 NOTE — Progress Notes (Signed)
Physical Therapy Treatment Patient Details Name: Ian Bond MRN: [id number] DOB: February 07, 1976 Today's Date: 11/21/2017    History of Present Illness 42yo obese M who has not sought medical care in years, and was admitted with nausea/vomiting and shortness of breath.  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 Exam revealed foul-smelling necrotic toes on both feet.  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    PT Comments    Pt admitted with above diagnosis. Pt currently with functional limitations due to balance and endurance deficits. Pt progressing. Min guard assist for transfer to chair lateral scoot today with post op shoes in place.  Still working with ortho techs to get shoes proper fit.  Pt will benefit from SNF for proper time to heal from surgery and for practice with mobility.  Will follow acutely.  Pt will benefit from skilled PT to increase their independence and safety with mobility to allow discharge to the venue listed below.     Follow Up Recommendations  Supervision/Assistance - 24 hour;SNF     Equipment Recommendations  Wheelchair (measurements PT);Wheelchair cushion (measurements PT);3in1 (PT);Other (comment);Rolling walker with 5" wheels    Recommendations for Other Services       Precautions / Restrictions Precautions Precautions: Fall Required Braces or Orthoses: Other Brace/Splint Other Brace/Splint: bil post op shoes Restrictions Weight Bearing Restrictions: No RLE Weight Bearing: Weight bearing as tolerated LLE Weight Bearing: Weight bearing as tolerated Other Position/Activity Restrictions: for transfers to wheelchair only    Mobility  Bed Mobility Overal bed mobility: Independent                Transfers Overall transfer level: Needs assistance   Transfers: Lateral/Scoot Transfers          Lateral/Scoot Transfers: Min guard General transfer comment: Scooted from bed to drop arm relciner with min guard assisst and cues.   Pt did several push ups in chair to make him comfortable.   Ambulation/Gait             General Gait Details: Order for no ambulation.    Stairs             Wheelchair Mobility    Modified Rankin (Stroke Patients Only)       Balance Overall balance assessment: Needs assistance Sitting-balance support: No upper extremity supported;Feet supported Sitting balance-Leahy Scale: Good                                      Cognition Arousal/Alertness: Awake/alert Behavior During Therapy: WFL for tasks assessed/performed Overall Cognitive Status: Within Functional Limits for tasks assessed                                        Exercises General Exercises - Lower Extremity Ankle Circles/Pumps: AROM;Both;10 reps;Seated    General Comments        Pertinent Vitals/Pain Pain Assessment: Faces Faces Pain Scale: Hurts even more Pain Location: feet Pain Descriptors / Indicators: Aching;Grimacing;Guarding Pain Intervention(s): Limited activity within patient's tolerance;Monitored during session;Premedicated before session;Repositioned  VSS  Home Living                      Prior Function            PT Goals (current goals can now  be found in the care plan section) Acute Rehab PT Goals Patient Stated Goal: to go home Progress towards PT goals: Progressing toward goals    Frequency    Min 3X/week      PT Plan Discharge plan needs to be updated    Co-evaluation              AM-PAC PT "6 Clicks" Daily Activity  Outcome Measure  Difficulty turning over in bed (including adjusting bedclothes, sheets and blankets)?: None Difficulty moving from lying on back to sitting on the side of the bed? : None Difficulty sitting down on and standing up from a chair with arms (e.g., wheelchair, bedside commode, etc,.)?: A Lot Help needed moving to and from a bed to chair (including a wheelchair)?: A Little Help needed  walking in hospital room?: Total Help needed climbing 3-5 steps with a railing? : Total 6 Click Score: 15    End of Session Equipment Utilized During Treatment: Gait belt Activity Tolerance: Patient limited by fatigue;Patient limited by pain Patient left: in chair;with call bell/phone within reach Nurse Communication: Mobility status PT Visit Diagnosis: Unsteadiness on feet (R26.81);Muscle weakness (generalized) (M62.81);Pain Pain - Right/Left: (bil) Pain - part of body: Ankle and joints of foot     Time: 1140-1220 PT Time Calculation (min) (ACUTE ONLY): 40 min  Charges:  $Therapeutic Exercise: 8-22 mins $Therapeutic Activity: 23-37 mins                    G Codes:       Ponce Skillman,PT Acute Rehabilitation [phone] (pager)    Denice Paradise 11/21/2017, 12:26 PM

## 2017-11-21 NOTE — Telephone Encounter (Signed)
Message received from Chriss Driver- Cherlyn Cushing, RN CM noting that the patient will be discharged to SNF,  His appointment at [facility] has been rescheduled to 12/13/17 @ 0950 and the AVS was updated.   Call placed to [facility] at the [facility] to inform her that the patient will be going to rehab and will not need the ramp at this time.

## 2017-11-21 NOTE — Progress Notes (Signed)
Pt note Current cast shoes do not fit due to size of pts' wide foot.  Ortho techs to call outside vendor to make shoes for pt.  Thanks. [place] [phone] (pager)

## 2017-11-21 NOTE — Care Management Note (Signed)
Case Management Note  Patient Details  Name: Ian Bond MRN: [id number] Date of Birth: Dec 06, 1975  Subjective/Objective: Pt presented for DKA- Diabetic Foot Wounds. S/p Transmetatarsal Amputation B LE. PTA from home with girlfriend. 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 CM initially felt that pt would be able to go home. After speaking with both patient and girlfriend- pt lives in a mobile home that they rent that has 5 steps to enter the front door. Girlfriend states she has heart issues and would be unable to lift the patient if needed to get into [facility] and to appointments. Pt stated that when PT tried to stand him up yesterday he was in pain. CM did speak with CSW to see if SNF was an option. AD Zack approved 2 weeks LOG for patient for SNF/ Rehab.                     Action/Plan: CM had reached out to Opal Sidles 11-20-17 with the [place] was abe to schedule a hospital follow up appointment @ the [facility] and Information placed on the AVS. Post SNF pt will be able to utilize the Clinic for Medication Assistance as well. CM did ask Opal Sidles for assistance with ramp and she provided CM with information to the [facility]. Girlfriend to speak with landlord of Mobile Home to see if a ramp could be added since they rent. Post SNF PT to re-evaluate to see the equipment needs for patient. CM will continue to monitor.   Expected Discharge Date:                 Expected Discharge Plan:  [place]  In-House Referral:  Clinical Social Work  Discharge planning Services  CM Consult, Follow-up appt scheduled, Medication Assistance  Post Acute Care Choice:  NA Choice offered to:  NA  DME Arranged:  N/A DME Agency:  NA  HH Arranged:  NA HH Agency:  NA  Status of Service:  Completed, signed off  If discussed at [place] of Stay Meetings, dates discussed:  11-21-17  Additional Comments:  Bethena Roys, RN 11/21/2017, 10:12 AM

## 2017-11-22 DIAGNOSIS — E1165 Type 2 diabetes mellitus with hyperglycemia: Secondary | ICD-10-CM

## 2017-11-22 LAB — BASIC METABOLIC PANEL
Anion gap: 11 (ref 5–15)
CALCIUM: 8.7 mg/dL — AB (ref 8.9–10.3)
CO2: 24 mmol/L (ref 22–32)
Chloride: 99 mmol/L — ABNORMAL LOW (ref 101–111)
Creatinine, Ser: 0.75 mg/dL (ref 0.61–1.24)
GFR calc Af Amer: >60 mL/min (ref >60)
Glucose, Bld: 180 mg/dL — ABNORMAL HIGH (ref 65–99)
Potassium: 3.1 mmol/L — ABNORMAL LOW (ref 3.5–5.1)
SODIUM: 134 mmol/L — AB (ref 135–145)

## 2017-11-22 LAB — DIFFERENTIAL
BAND NEUTROPHILS: 12 %
BASOS ABS: 0 10*3/uL (ref 0.0–0.1)
BLASTS: 0 %
Basophils Relative: 0 %
EOS ABS: 0 10*3/uL (ref 0.0–0.7)
Eosinophils Relative: 0 %
LYMPHS PCT: 20 %
Lymphs Abs: 5.4 10*3/uL — ABNORMAL HIGH (ref 0.7–4.0)
METAMYELOCYTES PCT: 3 %
MONOS PCT: 3 %
Monocytes Absolute: 0.8 10*3/uL (ref 0.1–1.0)
Myelocytes: 0 %
NRBC: 0 /100{WBCs}
Neutro Abs: 20.9 10*3/uL — ABNORMAL HIGH (ref 1.7–7.7)
Neutrophils Relative %: 62 %
Other: 0 %
PROMYELOCYTES RELATIVE: 0 %

## 2017-11-22 LAB — CBC
HCT: 27.2 % — ABNORMAL LOW (ref 39.0–52.0)
Hemoglobin: 9.3 g/dL — ABNORMAL LOW (ref 13.0–17.0)
MCH: 28.8 pg (ref 26.0–34.0)
MCHC: 34.2 g/dL (ref 30.0–36.0)
MCV: 84.2 fL (ref 78.0–100.0)
PLATELETS: 523 10*3/uL — AB (ref 150–400)
RBC: 3.23 MIL/uL — ABNORMAL LOW (ref 4.22–5.81)
RDW: 12.4 % (ref 11.5–15.5)
WBC: 27.2 10*3/uL — AB (ref 4.0–10.5)

## 2017-11-22 LAB — GLUCOSE, CAPILLARY
Glucose-Capillary: 200 mg/dL — ABNORMAL HIGH (ref 65–99)
Glucose-Capillary: 188 mg/dL — ABNORMAL HIGH (ref 65–99)
Glucose-Capillary: 182 mg/dL — ABNORMAL HIGH (ref 65–99)
Glucose-Capillary: 164 mg/dL — ABNORMAL HIGH (ref 65–99)

## 2017-11-22 MED ORDER — INSULIN ASPART 100 UNIT/ML ~~LOC~~ SOLN
0.0000 [IU] | Freq: Three times a day (TID) | SUBCUTANEOUS | Status: DC
  Administered 2017-11-22 – 2017-11-23 (×3): 4 [IU] via SUBCUTANEOUS
  Administered 2017-11-23 – 2017-11-24 (×3): 3 [IU] via SUBCUTANEOUS
  Administered 2017-11-24 (×2): 4 [IU] via SUBCUTANEOUS
  Administered 2017-11-25: 11 [IU] via SUBCUTANEOUS
  Administered 2017-11-25 – 2017-11-27 (×6): 4 [IU] via SUBCUTANEOUS

## 2017-11-22 MED ORDER — INSULIN ASPART 100 UNIT/ML ~~LOC~~ SOLN
6.0000 [IU] | Freq: Three times a day (TID) | SUBCUTANEOUS | Status: DC
  Administered 2017-11-22 – 2017-11-27 (×17): 6 [IU] via SUBCUTANEOUS

## 2017-11-22 MED ORDER — GERHARDT'S BUTT CREAM
TOPICAL_CREAM | Freq: Four times a day (QID) | CUTANEOUS | Status: DC | PRN
  Administered 2017-11-22: 06:00:00 via TOPICAL
  Filled 2017-11-22: qty 1

## 2017-11-22 MED ORDER — INSULIN GLARGINE 100 UNIT/ML ~~LOC~~ SOLN
40.0000 [IU] | SUBCUTANEOUS | Status: DC
  Administered 2017-11-22 – 2017-11-26 (×5): 40 [IU] via SUBCUTANEOUS
  Filled 2017-11-22 (×6): qty 0.4

## 2017-11-22 NOTE — Progress Notes (Signed)
Nutrition Follow-up/consult  Pt consult for diet education. Diabetes education given on 11/20/17. Pt with no further questions at this time.   Hope Budds, Dietetic Intern

## 2017-11-22 NOTE — Progress Notes (Signed)
PROGRESS NOTE    Ian Bond  VOJ:[id number] DOB: [birth year] DOA: 11/16/2017 PCP: Patient, No Pcp Per   Brief Narrative: Ian Bond is a 42 y.o. male with a history of obesity.  He presented secondary to nausea, vomiting, and dyspnea and was found to be in DKA.  He was treated for DKA with insulin drip was transitioned to subcutaneous insulin.  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  He also developed diarrhea, found to have bacterial gastroenteritis.   Assessment & Plan:   Principal Problem:   DKA (diabetic ketoacidoses) ([place]) Active Problems:   Diabetic infection of left foot ([place])   Diabetic infection of right foot ([place])   Gangrene of left foot ([place])   Gangrene of right foot ([place])   DKA The setting of newly diagnosed diabetes mellitus.  Hemoglobin A1c of 12.2%.  Patient treated with IV insulin and was transitioned to subcutaneous insulin.  Resolved  Diabetes mellitus, type II, uncontrolled with hyperglycemia As mentioned above, patient presented in DKA.  Patient is transition to subcutaneous Lantus and NovoLog.  Fasting blood sugars are uncontrolled.  Patient required 34 units of NovoLog over the last 24 hours -Increase Lantus to 40 units nightly -Increase  to resistant sliding scale and continue nightly dosing -Increase to NovoLog 6 units 3 times daily with meals  Gangrene of right and left foot Diabetic infection of bilateral feet Patient is status post bilateral transmetatarsal amputation on 4/27 by orthopedic surgery.  Patient is currently weightbearing on heels for transfers only.  No gait training. -Orthopedic surgery recommendations  Gastroenteritis GI pathogen panel significant for EAEC.  Patient was started on ciprofloxacin for treatment.  Symptoms appear mild. -Discontinue ciprofloxacin  Leukocytosis Likely reactive secondary to acute GI infection.  Foot wounds appear to uninfected.  No  associated fevers. -Repeat CBC -Add on differential  Acute kidney injury Resolved  Ethanol abuse No withdrawal symptoms.  Obesity Body mass index is 33.7 kg/m.   DVT prophylaxis: Heparin subcutaneous Code Status:   Code Status: Full Code Family Communication: None at bedside Disposition Plan: Discharge to SNF when medically ready for discharge   Consultants:   Orthopedic surgery  Procedures:   Bilateral transmetatarsal amputation (4/27)  Antimicrobials:  Vancomycin (4/25>>4/29)  Zosyn (4/25>>4/29)  Ciprofloxacin (4/29>>   Subjective: Patient reports issues with fecal incontinence overnight.  Objective: Vitals:   11/21/17 1434 11/21/17 2051 11/22/17 0600 11/22/17 0804  BP: 120/74 122/86 (!) 135/91 127/81  Pulse: (!) 106 96  96  Resp: (!) 21 16 16 20   Temp: 99.2 F (37.3 C) 99.1 F (37.3 C) 98.9 F (37.2 C) 99.2 F (37.3 C)  TempSrc: Oral Oral Oral Oral  SpO2:  95%    Weight:   122.3 kg (269 lb 10 oz)   Height:        Intake/Output Summary (Last 24 hours) at 11/22/2017 1119 Last data filed at 11/22/2017 0957 Gross per 24 hour  Intake 1980 ml  Output 2300 ml  Net -320 ml   Filed Weights   11/20/17 0549 11/21/17 0434 11/22/17 0600  Weight: 117.3 kg (258 lb 9.6 oz) 118.9 kg (262 lb 2 oz) 122.3 kg (269 lb 10 oz)    Examination:  General exam: Appears calm and comfortable Respiratory system: Clear to auscultation. Respiratory effort normal. Cardiovascular system: S1 & S2 heard, RRR. No murmurs, rubs, gallops or clicks. Gastrointestinal system: Abdomen is distended, soft and nontender. No organomegaly or masses felt. Mostly normal bowel sounds  with some mildly increased sounds in some quadrants. Central nervous system: Alert and oriented. No focal neurological deficits. Extremities: No edema. No calf tenderness. Bilateral transmetatarsal amputations with clean/intact incision; sutures exposed Skin: No cyanosis. No rashes Psychiatry: Judgement and  insight appear normal. Mood & affect appropriate.     Data Reviewed: I have personally reviewed following labs and imaging studies  CBC: Recent Labs  Lab 11/18/17 0246 11/19/17 0421 11/20/17 0340 11/21/17 0942 11/22/17 0339  WBC 18.1* 21.4* 23.8* 25.2* 27.2*  HGB 11.4* 9.3* 9.1* 9.6* 9.3*  HCT 32.1* 26.9* 26.3* 28.3* 27.2*  MCV 80.7 82.0 83.2 84.7 84.2  PLT 460* 391 441* 504* 703*   Basic Metabolic Panel: Recent Labs  Lab 11/16/17 1221 11/16/17 1529  11/17/17 0313  11/18/17 0246 11/19/17 0421 11/20/17 0340 11/21/17 0942 11/22/17 0339  NA 131* 133*   < > 138   < > 136 135 134* 132* 134*  K 4.9 4.3   < > 3.9   < > 3.6 3.7 3.4* 3.1* 3.1*  CL 95* 103   < > 108   < > 107 99* 101 100* 99*  CO2 7* 14*   < > 18*   < > 20* 24 24 24 24   GLUCOSE 585* 297*   < > 112*   < > 358* 307* 244* 296* 180*  BUN 49* 43*   < > 28*   < > 19 15 8  5* <5*  CREATININE 2.35* 1.73*   < > 0.96   < > 0.98 1.04 0.95 0.86 0.75  CALCIUM 9.1 8.9   < > 9.3   < > 9.7 8.9 8.7* 8.6* 8.7*  PHOS 4.2 1.6*  --  2.1*  --   --   --   --   --   --    < > = values in this interval not displayed.   GFR: Estimated Creatinine Clearance: 167.7 mL/min (by C-G formula based on SCr of 0.75 mg/dL). Liver Function Tests: Recent Labs  Lab 11/16/17 0825 11/18/17 0246 11/19/17 0421  AST 23 22 17   ALT 18 16* 15*  ALKPHOS 171* 102 96  BILITOT 1.9* 0.7 0.8  PROT 9.2* 7.1 6.5  ALBUMIN 2.6* 2.0* 1.7*   Recent Labs  Lab 11/16/17 0825  LIPASE 77*   No results for input(s): AMMONIA in the last 168 hours. Coagulation Profile: No results for input(s): INR, PROTIME in the last 168 hours. Cardiac Enzymes: No results for input(s): CKTOTAL, CKMB, CKMBINDEX, TROPONINI in the last 168 hours. BNP (last 3 results) No results for input(s): PROBNP in the last 8760 hours. HbA1C: No results for input(s): HGBA1C in the last 72 hours. CBG: Recent Labs  Lab 11/21/17 0721 11/21/17 1119 11/21/17 1625 11/21/17 2111  11/22/17 0801  GLUCAP 263* 336* 176* 129* 200*   Lipid Profile: No results for input(s): CHOL, HDL, LDLCALC, TRIG, CHOLHDL, LDLDIRECT in the last 72 hours. Thyroid Function Tests: No results for input(s): TSH, T4TOTAL, FREET4, T3FREE, THYROIDAB in the last 72 hours. Anemia Panel: No results for input(s): VITAMINB12, FOLATE, FERRITIN, TIBC, IRON, RETICCTPCT in the last 72 hours. Sepsis Labs: Recent Labs  Lab 11/16/17 0905 11/16/17 1029 11/16/17 1358 11/16/17 1529  LATICACIDVEN 4.47* 3.98* 2.4* 1.9    Recent Results (from the past 240 hour(s))  MRSA PCR Screening     Status: None   Collection Time: 11/16/17  1:51 PM  Result Value Ref Range Status   MRSA by PCR NEGATIVE NEGATIVE Final    Comment:  The GeneXpert MRSA Assay (FDA approved for NASAL specimens only), is one component of a comprehensive MRSA colonization surveillance program. It is not intended to diagnose MRSA infection nor to guide or monitor treatment for MRSA infections. Performed at [facility], [place] [address] [place], [place] [address]   Culture, blood (routine x 2)     Status: None   Collection Time: 11/16/17  1:55 PM  Result Value Ref Range Status   Specimen Description BLOOD HAND  Final   Special Requests   Final    BOTTLES DRAWN AEROBIC AND ANAEROBIC Blood Culture results may not be optimal due to an inadequate volume of blood received in culture bottles   Culture   Final    NO GROWTH 5 DAYS Performed at [facility], [place] [address] [place], [place] [address]    Report Status 11/21/2017 FINAL  Final  Culture, blood (routine x 2)     Status: None   Collection Time: 11/16/17  2:00 PM  Result Value Ref Range Status   Specimen Description BLOOD LEFT HAND  Final   Special Requests   Final    BOTTLES DRAWN AEROBIC ONLY Blood Culture adequate volume   Culture   Final    NO GROWTH 5 DAYS Performed at [facility], [place] [address], [place], [place] [address]    Report Status  11/21/2017 FINAL  Final  Surgical pcr screen     Status: None   Collection Time: 11/18/17 12:00 AM  Result Value Ref Range Status   MRSA, PCR NEGATIVE NEGATIVE Final   Staphylococcus aureus NEGATIVE NEGATIVE Final    Comment: (NOTE) The Xpert SA Assay (FDA approved for NASAL specimens in patients 43 years of age and older), is one component of a comprehensive surveillance program. It is not intended to diagnose infection nor to guide or monitor treatment. Performed at [facility], [place] [address], [place], [place] [address]   C difficile quick scan w PCR reflex     Status: None   Collection Time: 11/20/17 11:40 AM  Result Value Ref Range Status   C Diff antigen NEGATIVE NEGATIVE Final   C Diff toxin NEGATIVE NEGATIVE Final   C Diff interpretation No C. difficile detected.  Final  Gastrointestinal Panel by PCR , Stool     Status: Abnormal   Collection Time: 11/20/17 11:40 AM  Result Value Ref Range Status   Campylobacter species NOT DETECTED NOT DETECTED Final   Plesimonas shigelloides NOT DETECTED NOT DETECTED Final   Salmonella species NOT DETECTED NOT DETECTED Final   Yersinia enterocolitica NOT DETECTED NOT DETECTED Final   Vibrio species NOT DETECTED NOT DETECTED Final   Vibrio cholerae NOT DETECTED NOT DETECTED Final   Enteroaggregative E coli (EAEC) DETECTED (A) NOT DETECTED Final    Comment: RESULT CALLED TO, READ BACK BY AND VERIFIED WITH: MELYNN MUELLER AT 1850 11/20/2017.  TFK    Enteropathogenic E coli (EPEC) NOT DETECTED NOT DETECTED Final   Enterotoxigenic E coli (ETEC) NOT DETECTED NOT DETECTED Final   Shiga like toxin producing E coli (STEC) NOT DETECTED NOT DETECTED Final   Shigella/Enteroinvasive E coli (EIEC) NOT DETECTED NOT DETECTED Final   Cryptosporidium NOT DETECTED NOT DETECTED Final   Cyclospora cayetanensis NOT DETECTED NOT DETECTED Final   Entamoeba histolytica NOT DETECTED NOT DETECTED Final   Giardia lamblia NOT DETECTED NOT DETECTED Final    Adenovirus F40/41 NOT DETECTED NOT DETECTED Final   Astrovirus NOT DETECTED NOT  DETECTED Final   Norovirus GI/GII NOT DETECTED NOT DETECTED Final   Rotavirus A NOT DETECTED NOT DETECTED Final   Sapovirus (I, II, IV, and V) NOT DETECTED NOT DETECTED Final    Comment: Performed at [facility], [address], [place], [place] [address]         Radiology Studies: No results found.      Scheduled Meds: . chlorhexidine  15 mL Mouth Rinse BID  . ciprofloxacin  500 mg Oral BID  . feeding supplement (GLUCERNA SHAKE)  237 mL Oral BID BM  . gabapentin  300 mg Oral TID  . heparin  5,000 Units Subcutaneous Q8H  . insulin aspart  0-20 Units Subcutaneous TID WC  . insulin aspart  0-5 Units Subcutaneous QHS  . insulin aspart  6 Units Subcutaneous TID WC  . insulin glargine  40 Units Subcutaneous Q24H  . mouth rinse  15 mL Mouth Rinse q12n4p  . metoprolol tartrate  12.5 mg Oral BID  . saccharomyces boulardii  250 mg Oral BID   Continuous Infusions: . sodium chloride 100 mL/hr at 11/22/17 0052  . methocarbamol (ROBAXIN)  IV    . sodium chloride       LOS: 6 days     Cordelia Poche, MD [facility] 11/22/2017, 11:19 AM Pager: [phone]  If 7PM-7AM, please contact night-coverage [email or web address] Password [email or web address] 11/22/2017, 11:19 AM

## 2017-11-22 NOTE — Progress Notes (Signed)
Changed dressing on pt's left and right foot per md order. Dressing clean dry and intact.

## 2017-11-22 NOTE — Progress Notes (Signed)
Patient ID: Ian Bond, male   DOB: 17-Aug-1975, 42 y.o.   MRN: [id number] Patient surgical dressings were removed.  The bilateral transmetatarsal amputations are healing well.  May begin daily dressing changes.  Weightbearing on the heels for transfers only no gait training.

## 2017-11-23 ENCOUNTER — Inpatient Hospital Stay (HOSPITAL_COMMUNITY): Payer: Medicaid Other

## 2017-11-23 DIAGNOSIS — D473 Essential (hemorrhagic) thrombocythemia: Secondary | ICD-10-CM

## 2017-11-23 LAB — BASIC METABOLIC PANEL
Anion gap: 8 (ref 5–15)
CALCIUM: 8.3 mg/dL — AB (ref 8.9–10.3)
CO2: 22 mmol/L (ref 22–32)
CREATININE: 0.81 mg/dL (ref 0.61–1.24)
Chloride: 106 mmol/L (ref 101–111)
GFR calc Af Amer: >60 mL/min (ref >60)
GLUCOSE: 178 mg/dL — AB (ref 65–99)
POTASSIUM: 3.1 mmol/L — AB (ref 3.5–5.1)
Sodium: 136 mmol/L (ref 135–145)

## 2017-11-23 LAB — CBC
HCT: 25.2 % — ABNORMAL LOW (ref 39.0–52.0)
Hemoglobin: 8.5 g/dL — ABNORMAL LOW (ref 13.0–17.0)
MCH: 28.5 pg (ref 26.0–34.0)
MCHC: 33.7 g/dL (ref 30.0–36.0)
MCV: 84.6 fL (ref 78.0–100.0)
PLATELETS: 551 10*3/uL — AB (ref 150–400)
RBC: 2.98 MIL/uL — ABNORMAL LOW (ref 4.22–5.81)
RDW: 12.7 % (ref 11.5–15.5)
WBC: 26.4 10*3/uL — ABNORMAL HIGH (ref 4.0–10.5)

## 2017-11-23 LAB — GLUCOSE, CAPILLARY
GLUCOSE-CAPILLARY: 199 mg/dL — AB (ref 65–99)
GLUCOSE-CAPILLARY: 196 mg/dL — AB (ref 65–99)
Glucose-Capillary: 150 mg/dL — ABNORMAL HIGH (ref 65–99)
Glucose-Capillary: 144 mg/dL — ABNORMAL HIGH (ref 65–99)

## 2017-11-23 MED ORDER — POTASSIUM CHLORIDE CRYS ER 20 MEQ PO TBCR
40.0000 meq | EXTENDED_RELEASE_TABLET | Freq: Once | ORAL | Status: AC
  Administered 2017-11-23: 40 meq via ORAL
  Filled 2017-11-23: qty 2

## 2017-11-23 NOTE — Progress Notes (Signed)
Physical Therapy Treatment Patient Details Name: Ian Bond MRN: [id number] DOB: 1975/12/02 Today's Date: 11/23/2017    History of Present Illness 42yo obese M who has not sought medical care in years, and was admitted with nausea/vomiting and shortness of breath.  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 Exam revealed foul-smelling necrotic toes on both feet.  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    PT Comments    Pt is reluctant to try to stand, but obtained his agreement to try standing on RW at bedside.  He was able to steady his legs and step to chair in a pivoting pattern, and did well with nursing and PT there. Talked with nursing about just doing a lateral slide back to bed as pt is fearful of this and will not likely be able to stand well from chair.  Pt in agreement and got his legs positioned to protect and elevate feet on legrest of chair.   Follow Up Recommendations  Supervision/Assistance - 24 hour     Equipment Recommendations  Wheelchair (measurements PT);Wheelchair cushion (measurements PT);3in1 (PT);Other (comment);Rolling walker with 5" wheels    Recommendations for Other Services       Precautions / Restrictions Precautions Precautions: Fall Required Braces or Orthoses: Other Brace/Splint Other Brace/Splint: bil post op shoes Restrictions Weight Bearing Restrictions: Yes RLE Weight Bearing: Weight bearing as tolerated LLE Weight Bearing: Weight bearing as tolerated Other Position/Activity Restrictions: for transfers to wheelchair only    Mobility  Bed Mobility Overal bed mobility: Modified Independent             General bed mobility comments: extra time and effort due to foot pain and weakness  Transfers Overall transfer level: Needs assistance Equipment used: Rolling walker (2 wheeled);2 person hand held assist Transfers: Sit to/from Stand Sit to Stand: Mod assist;From elevated surface Stand pivot transfers: Mod  assist;From elevated surface       General transfer comment: nursing concerned about how to get back to bed and encouraged them to let pt just scoot on chair to bed with drop arm  Ambulation/Gait             General Gait Details: not permitted   Stairs             Wheelchair Mobility    Modified Rankin (Stroke Patients Only)       Balance Overall balance assessment: Needs assistance Sitting-balance support: Feet supported Sitting balance-Leahy Scale: Good       Standing balance-Leahy Scale: Poor                              Cognition Arousal/Alertness: Awake/alert Behavior During Therapy: WFL for tasks assessed/performed Overall Cognitive Status: Within Functional Limits for tasks assessed                                        Exercises      General Comments        Pertinent Vitals/Pain Pain Assessment: Faces Faces Pain Scale: Hurts little more Pain Location: feet Pain Descriptors / Indicators: Operative site guarding Pain Intervention(s): Limited activity within patient's tolerance;Monitored during session;Premedicated before session;Repositioned;RN gave pain meds during session    Home Living  Prior Function            PT Goals (current goals can now be found in the care plan section) Acute Rehab PT Goals Patient Stated Goal: get stronger  Progress towards PT goals: Progressing toward goals    Frequency    Min 3X/week      PT Plan Current plan remains appropriate    Co-evaluation              AM-PAC PT "6 Clicks" Daily Activity  Outcome Measure  Difficulty turning over in bed (including adjusting bedclothes, sheets and blankets)?: None Difficulty moving from lying on back to sitting on the side of the bed? : A Little Difficulty sitting down on and standing up from a chair with arms (e.g., wheelchair, bedside commode, etc,.)?: A Little Help needed moving to and  from a bed to chair (including a wheelchair)?: A Lot Help needed walking in hospital room?: Total Help needed climbing 3-5 steps with a railing? : Total 6 Click Score: 14    End of Session Equipment Utilized During Treatment: Gait belt Activity Tolerance: Patient limited by fatigue;Patient limited by pain Patient left: in chair;with call bell/phone within reach Nurse Communication: Mobility status PT Visit Diagnosis: Unsteadiness on feet (R26.81);Muscle weakness (generalized) (M62.81);Pain Pain - Right/Left: (Bilateral) Pain - part of body: Ankle and joints of foot     Time: 4709-6283 PT Time Calculation (min) (ACUTE ONLY): 24 min  Charges:  $Therapeutic Activity: 23-37 mins                    G Codes:  Functional Assessment Tool Used: AM-PAC 6 Clicks Basic Mobility     Ramond Dial 11/23/2017, 6:13 PM   Mee Hives, PT MS Acute Rehab Dept. Number: [place] [facility] [place]

## 2017-11-23 NOTE — Progress Notes (Signed)
PROGRESS NOTE    Ian Bond  VQM:[id number] DOB: 06/13/1976 DOA: 11/16/2017 PCP: Patient, No Pcp Per   Brief Narrative: Ian Bond is a 42 y.o. male with a history of obesity.  He presented secondary to nausea, vomiting, and dyspnea and was found to be in DKA.  He was treated for DKA with insulin drip was transitioned to subcutaneous insulin.  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  He also developed diarrhea, found to have bacterial gastroenteritis.   Assessment & Plan:   Principal Problem:   DKA (diabetic ketoacidoses) ([place]) Active Problems:   Diabetic infection of left foot ([place])   Diabetic infection of right foot ([place])   Gangrene of left foot ([place])   Gangrene of right foot ([place])   DKA The setting of newly diagnosed diabetes mellitus.  Hemoglobin A1c of 12.2%.  Patient treated with IV insulin and was transitioned to subcutaneous insulin.  Resolved  Diabetes mellitus, type II, uncontrolled with hyperglycemia As mentioned above, patient presented in DKA.  Patient is transition to subcutaneous Lantus and NovoLog.  Fasting blood sugars are uncontrolled.  Patient required 27 units of NovoLog over the last 24 hours -Continue Lantus to 40 units nightly -Continue resistant sliding scale and continue nightly dosing -Continue to NovoLog 6 units 3 times daily with meals  Gangrene of right and left foot Diabetic infection of bilateral feet Patient is status post bilateral transmetatarsal amputation on 4/27 by orthopedic surgery.  Patient is currently weightbearing on heels for transfers only.  No gait training. -Orthopedic surgery recommendations  Gastroenteritis GI pathogen panel significant for EAEC.  Patient was started on ciprofloxacin for treatment.  Symptoms appear mild. Discontinued ciprofloxacin. Still with symptoms. Self limiting. Expect course to run 5-7 days before improvement and 7-10 days before  resolution.  Leukocytosis Likely reactive secondary to acute GI infection.  Foot wounds appear to uninfected.  No associated fevers. Stable overnight -Repeat CBC in AM  Thrombocytosis Also likely reactive.  Acute kidney injury Resolved  Abdominal distension No tenderness with good bowel sounds. -Abdominal x-ray  Ethanol abuse No withdrawal symptoms.  Obesity Body mass index is 34.61 kg/m.   DVT prophylaxis: Heparin subcutaneous Code Status:   Code Status: Full Code Family Communication: None at bedside Disposition Plan: Discharge to SNF when medically ready for discharge   Consultants:   Orthopedic surgery  Procedures:   Bilateral transmetatarsal amputation (4/27)  Antimicrobials:  Vancomycin (4/25>>4/29)  Zosyn (4/25>>4/29)  Ciprofloxacin (4/29>>   Subjective: Patient reports issues with fecal incontinence overnight.  Objective: Vitals:   11/22/17 2057 11/23/17 0351 11/23/17 0632 11/23/17 0924  BP: 128/81  121/80 115/69  Pulse: 96 92 91   Resp: 16 16 17  (!) 24  Temp: 98.9 F (37.2 C)  98.9 F (37.2 C)   TempSrc: Oral  Oral   SpO2: 97%     Weight:   125.6 kg (276 lb 14.4 oz)   Height:        Intake/Output Summary (Last 24 hours) at 11/23/2017 1341 Last data filed at 11/23/2017 0900 Gross per 24 hour  Intake 2980 ml  Output 1850 ml  Net 1130 ml   Filed Weights   11/21/17 0434 11/22/17 0600 11/23/17 9326  Weight: 118.9 kg (262 lb 2 oz) 122.3 kg (269 lb 10 oz) 125.6 kg (276 lb 14.4 oz)    Examination:  General exam: Appears calm and comfortable Respiratory system: Clear to auscultation. Respiratory effort normal. Cardiovascular system: S1 & S2  heard, RRR. No murmurs. Gastrointestinal system: Abdomen is slightly more distended, soft and nontender. No organomegaly or masses felt. Normal bowel sounds heard. Central nervous system: Alert and oriented. No focal neurological deficits. Extremities: No edema. No calf tenderness Skin: No cyanosis.  No rashes Psychiatry: Judgement and insight appear normal. Depressed mood, flat affect. Slightly tearful this morning.    Data Reviewed: I have personally reviewed following labs and imaging studies  CBC: Recent Labs  Lab 11/19/17 0421 11/20/17 0340 11/21/17 0942 11/22/17 0339 11/23/17 0424  WBC 21.4* 23.8* 25.2* 27.2* 26.4*  NEUTROABS  --   --   --  20.9*  --   HGB 9.3* 9.1* 9.6* 9.3* 8.5*  HCT 26.9* 26.3* 28.3* 27.2* 25.2*  MCV 82.0 83.2 84.7 84.2 84.6  PLT 391 441* 504* 523* 834*   Basic Metabolic Panel: Recent Labs  Lab 11/16/17 1529  11/17/17 0313  11/19/17 0421 11/20/17 0340 11/21/17 0942 11/22/17 0339 11/23/17 0424  NA 133*   < > 138   < > 135 134* 132* 134* 136  K 4.3   < > 3.9   < > 3.7 3.4* 3.1* 3.1* 3.1*  CL 103   < > 108   < > 99* 101 100* 99* 106  CO2 14*   < > 18*   < > 24 24 24 24 22   GLUCOSE 297*   < > 112*   < > 307* 244* 296* 180* 178*  BUN 43*   < > 28*   < > 15 8 5* <5* <5*  CREATININE 1.73*   < > 0.96   < > 1.04 0.95 0.86 0.75 0.81  CALCIUM 8.9   < > 9.3   < > 8.9 8.7* 8.6* 8.7* 8.3*  PHOS 1.6*  --  2.1*  --   --   --   --   --   --    < > = values in this interval not displayed.   GFR: Estimated Creatinine Clearance: 167.8 mL/min (by C-G formula based on SCr of 0.81 mg/dL). Liver Function Tests: Recent Labs  Lab 11/18/17 0246 11/19/17 0421  AST 22 17  ALT 16* 15*  ALKPHOS 102 96  BILITOT 0.7 0.8  PROT 7.1 6.5  ALBUMIN 2.0* 1.7*   No results for input(s): LIPASE, AMYLASE in the last 168 hours. No results for input(s): AMMONIA in the last 168 hours. Coagulation Profile: No results for input(s): INR, PROTIME in the last 168 hours. Cardiac Enzymes: No results for input(s): CKTOTAL, CKMB, CKMBINDEX, TROPONINI in the last 168 hours. BNP (last 3 results) No results for input(s): PROBNP in the last 8760 hours. HbA1C: No results for input(s): HGBA1C in the last 72 hours. CBG: Recent Labs  Lab 11/22/17 1141 11/22/17 1757  11/22/17 2224 11/23/17 0816 11/23/17 1131  GLUCAP 188* 182* 164* 150* 144*   Lipid Profile: No results for input(s): CHOL, HDL, LDLCALC, TRIG, CHOLHDL, LDLDIRECT in the last 72 hours. Thyroid Function Tests: No results for input(s): TSH, T4TOTAL, FREET4, T3FREE, THYROIDAB in the last 72 hours. Anemia Panel: No results for input(s): VITAMINB12, FOLATE, FERRITIN, TIBC, IRON, RETICCTPCT in the last 72 hours. Sepsis Labs: Recent Labs  Lab 11/16/17 1358 11/16/17 1529  LATICACIDVEN 2.4* 1.9    Recent Results (from the past 240 hour(s))  MRSA PCR Screening     Status: None   Collection Time: 11/16/17  1:51 PM  Result Value Ref Range Status   MRSA by PCR NEGATIVE NEGATIVE Final    Comment:  The GeneXpert MRSA Assay (FDA approved for NASAL specimens only), is one component of a comprehensive MRSA colonization surveillance program. It is not intended to diagnose MRSA infection nor to guide or monitor treatment for MRSA infections. Performed at [facility], [place] [address], [place], [place] [address]   Culture, blood (routine x 2)     Status: None   Collection Time: 11/16/17  1:55 PM  Result Value Ref Range Status   Specimen Description BLOOD HAND  Final   Special Requests   Final    BOTTLES DRAWN AEROBIC AND ANAEROBIC Blood Culture results may not be optimal due to an inadequate volume of blood received in culture bottles   Culture   Final    NO GROWTH 5 DAYS Performed at [facility], [place] [address] [place], [place] [address]    Report Status 11/21/2017 FINAL  Final  Culture, blood (routine x 2)     Status: None   Collection Time: 11/16/17  2:00 PM  Result Value Ref Range Status   Specimen Description BLOOD LEFT HAND  Final   Special Requests   Final    BOTTLES DRAWN AEROBIC ONLY Blood Culture adequate volume   Culture   Final    NO GROWTH 5 DAYS Performed at [facility], [place] [address] [place], [place] [address]    Report Status 11/21/2017  FINAL  Final  Surgical pcr screen     Status: None   Collection Time: 11/18/17 12:00 AM  Result Value Ref Range Status   MRSA, PCR NEGATIVE NEGATIVE Final   Staphylococcus aureus NEGATIVE NEGATIVE Final    Comment: (NOTE) The Xpert SA Assay (FDA approved for NASAL specimens in patients 74 years of age and older), is one component of a comprehensive surveillance program. It is not intended to diagnose infection nor to guide or monitor treatment. Performed at [facility], [place] [address] [place], [place] [address]   C difficile quick scan w PCR reflex     Status: None   Collection Time: 11/20/17 11:40 AM  Result Value Ref Range Status   C Diff antigen NEGATIVE NEGATIVE Final   C Diff toxin NEGATIVE NEGATIVE Final   C Diff interpretation No C. difficile detected.  Final  Gastrointestinal Panel by PCR , Stool     Status: Abnormal   Collection Time: 11/20/17 11:40 AM  Result Value Ref Range Status   Campylobacter species NOT DETECTED NOT DETECTED Final   Plesimonas shigelloides NOT DETECTED NOT DETECTED Final   Salmonella species NOT DETECTED NOT DETECTED Final   Yersinia enterocolitica NOT DETECTED NOT DETECTED Final   Vibrio species NOT DETECTED NOT DETECTED Final   Vibrio cholerae NOT DETECTED NOT DETECTED Final   Enteroaggregative E coli (EAEC) DETECTED (A) NOT DETECTED Final    Comment: RESULT CALLED TO, READ BACK BY AND VERIFIED WITH: MELYNN MUELLER AT 1850 11/20/2017.  TFK    Enteropathogenic E coli (EPEC) NOT DETECTED NOT DETECTED Final   Enterotoxigenic E coli (ETEC) NOT DETECTED NOT DETECTED Final   Shiga like toxin producing E coli (STEC) NOT DETECTED NOT DETECTED Final   Shigella/Enteroinvasive E coli (EIEC) NOT DETECTED NOT DETECTED Final   Cryptosporidium NOT DETECTED NOT DETECTED Final   Cyclospora cayetanensis NOT DETECTED NOT DETECTED Final   Entamoeba histolytica NOT DETECTED NOT DETECTED Final   Giardia lamblia NOT DETECTED NOT DETECTED Final   Adenovirus  F40/41 NOT DETECTED NOT DETECTED Final   Astrovirus NOT DETECTED NOT  DETECTED Final   Norovirus GI/GII NOT DETECTED NOT DETECTED Final   Rotavirus A NOT DETECTED NOT DETECTED Final   Sapovirus (I, II, IV, and V) NOT DETECTED NOT DETECTED Final    Comment: Performed at [facility], [address], [place], [place] [address]         Radiology Studies: No results found.      Scheduled Meds: . chlorhexidine  15 mL Mouth Rinse BID  . feeding supplement (GLUCERNA SHAKE)  237 mL Oral BID BM  . gabapentin  300 mg Oral TID  . heparin  5,000 Units Subcutaneous Q8H  . insulin aspart  0-20 Units Subcutaneous TID WC  . insulin aspart  0-5 Units Subcutaneous QHS  . insulin aspart  6 Units Subcutaneous TID WC  . insulin glargine  40 Units Subcutaneous Q24H  . mouth rinse  15 mL Mouth Rinse q12n4p  . metoprolol tartrate  12.5 mg Oral BID  . saccharomyces boulardii  250 mg Oral BID   Continuous Infusions: . methocarbamol (ROBAXIN)  IV    . sodium chloride       LOS: 7 days     Cordelia Poche, MD [facility] 11/23/2017, 1:41 PM Pager: [phone]  If 7PM-7AM, please contact night-coverage [email or web address] Password [email or web address] 11/23/2017, 1:41 PM

## 2017-11-23 NOTE — Progress Notes (Signed)
Inpatient Diabetes Program Recommendations  AACE/ADA: New Consensus Statement on Inpatient Glycemic Control (2015)  Target Ranges:  Prepandial:   less than 140 mg/dL      Peak postprandial:   less than 180 mg/dL (1-2 hours)      Critically ill patients:  140 - 180 mg/dL   Lab Results  Component Value Date   GLUCAP 150 (H) 11/23/2017   HGBA1C 12.2 (H) 11/17/2017    Review of Glycemic Control  Blood sugars trending well with Lantus 40 units Q24H, Novolog 0-20 units tidwc and hs + 6 units tidwc  Inpatient Diabetes Program Recommendations:     Patient does not have health insurance, so cheaper forms of diabetes medications will need to be considered when discharged from SNF. Patient will need a PCP to follow after discharge from SNF.  For discharge from SNF: Consider NPH 25 units QAM, 15 units QHS Novolin R 6 units bid +_ 0-20 units tidwc and hs.  Spoke with pt at length regarding checking blood sugars and taking insulin consistently.  Continue to follow.  Thank you. Lorenda Peck, RD, LDN, CDE Inpatient Diabetes Coordinator [phone]

## 2017-11-23 NOTE — Progress Notes (Signed)
CSW continuing to follow. Per MD, patient not medically ready today. CSW updated facility. Patient will go to [facility] on LOG when medically ready. CSW to support with discharge.  Estanislado Emms, [place]

## 2017-11-24 ENCOUNTER — Inpatient Hospital Stay (HOSPITAL_COMMUNITY): Payer: Medicaid Other

## 2017-11-24 LAB — CBC
HEMATOCRIT: 25.4 % — AB (ref 39.0–52.0)
HEMOGLOBIN: 8.5 g/dL — AB (ref 13.0–17.0)
MCH: 28.4 pg (ref 26.0–34.0)
MCHC: 33.5 g/dL (ref 30.0–36.0)
MCV: 84.9 fL (ref 78.0–100.0)
Platelets: 599 10*3/uL — ABNORMAL HIGH (ref 150–400)
RBC: 2.99 MIL/uL — ABNORMAL LOW (ref 4.22–5.81)
RDW: 12.8 % (ref 11.5–15.5)
WBC: 23.3 10*3/uL — AB (ref 4.0–10.5)

## 2017-11-24 LAB — GLUCOSE, CAPILLARY
GLUCOSE-CAPILLARY: 184 mg/dL — AB (ref 65–99)
GLUCOSE-CAPILLARY: 133 mg/dL — AB (ref 65–99)
Glucose-Capillary: 151 mg/dL — ABNORMAL HIGH (ref 65–99)
Glucose-Capillary: 129 mg/dL — ABNORMAL HIGH (ref 65–99)

## 2017-11-24 LAB — POTASSIUM: Potassium: 3.1 mmol/L — ABNORMAL LOW (ref 3.5–5.1)

## 2017-11-24 MED ORDER — POTASSIUM CHLORIDE CRYS ER 20 MEQ PO TBCR
40.0000 meq | EXTENDED_RELEASE_TABLET | ORAL | Status: AC
  Administered 2017-11-24 (×2): 40 meq via ORAL
  Filled 2017-11-24 (×2): qty 2

## 2017-11-24 NOTE — Progress Notes (Signed)
PROGRESS NOTE    Ian Bond  ZJI:[id number] DOB: 23-Mar-1976 DOA: 11/16/2017 PCP: Patient, No Pcp Per   Brief Narrative: Ian Bond is a 42 y.o. male with a history of obesity.  He presented secondary to nausea, vomiting, and dyspnea and was found to be in DKA.  He was treated for DKA with insulin drip was transitioned to subcutaneous insulin.  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  He also developed diarrhea, found to have bacterial gastroenteritis.   Assessment & Plan:   Principal Problem:   DKA (diabetic ketoacidoses) ([place]) Active Problems:   Diabetic infection of left foot ([place])   Diabetic infection of right foot ([place])   Gangrene of left foot ([place])   Gangrene of right foot ([place])   DKA The setting of newly diagnosed diabetes mellitus.  Hemoglobin A1c of 12.2%.  Patient treated with IV insulin and was transitioned to subcutaneous insulin.  Resolved  Diabetes mellitus, type II, uncontrolled with hyperglycemia As mentioned above, patient presented in DKA.  Patient is transition to subcutaneous Lantus and NovoLog.  Fasting blood sugars are still slightly uncontrolled this morning.  Patient required 28 units of NovoLog over the last 24 hours -Continue Lantus to 40 units nightly -Continue resistant sliding scale and continue nightly dosing -Continue to NovoLog 6 units 3 times daily with meals  Gangrene of right and left foot Diabetic infection of bilateral feet Patient is status post bilateral transmetatarsal amputation on 4/27 by orthopedic surgery.  Patient is currently weightbearing on heels for transfers only.  No gait training. -Orthopedic surgery recommendations  Gastroenteritis GI pathogen panel significant for EAEC.  Patient was started on ciprofloxacin for treatment.  Symptoms appear mild. Discontinued ciprofloxacin. Still with symptoms. Self limiting. Expect course to run 5-7 days before  improvement and 7-10 days before resolution.  Leukocytosis Likely reactive secondary to acute GI infection.  Foot wounds appear to uninfected.  No associated fevers. Improving.  Thrombocytosis Also likely reactive.  Acute kidney injury Resolved  Ileus Seen on x-ray. Patient with watery stools. Increase in flatus today. -Full liquid diet  Ethanol abuse No withdrawal symptoms.  Obesity Body mass index is 35.85 kg/m.   DVT prophylaxis: Heparin subcutaneous Code Status:   Code Status: Full Code Family Communication: Fiance at bedside Disposition Plan: Discharge to SNF when medically ready for discharge   Consultants:   Orthopedic surgery  Procedures:   Bilateral transmetatarsal amputation (4/27)  Antimicrobials:  Vancomycin (4/25>>4/29)  Zosyn (4/25>>4/29)  Ciprofloxacin (4/29>>5/1)   Subjective: Passing gas today. Still with diarrhea. No abdominal pain.  Objective: Vitals:   11/23/17 1500 11/23/17 2108 11/24/17 0547 11/24/17 0909  BP: 123/85 110/71 135/88 130/80  Pulse: 95 93 85 80  Resp: 19 20 (!) 22   Temp: 99 F (37.2 C) 99.1 F (37.3 C)    TempSrc: Oral Oral    SpO2: 100% 96% 96%   Weight:   130.1 kg (286 lb 13.1 oz)   Height:        Intake/Output Summary (Last 24 hours) at 11/24/2017 1351 Last data filed at 11/23/2017 2211 Gross per 24 hour  Intake 240 ml  Output 800 ml  Net -560 ml   Filed Weights   11/22/17 0600 11/23/17 0632 11/24/17 0547  Weight: 122.3 kg (269 lb 10 oz) 125.6 kg (276 lb 14.4 oz) 130.1 kg (286 lb 13.1 oz)    Examination:  General exam: Appears calm and comfortable Respiratory system: Clear to auscultation. Respiratory effort  normal. Cardiovascular system: S1 & S2 heard, RRR. No murmurs. Gastrointestinal system: Abdomen is distended, soft and nontender. Normal bowel sounds heard. Central nervous system: Alert and oriented. No focal neurological deficits. Extremities: No calf tenderness Skin: No cyanosis. No rashes.  Oozing from left foot stump. Psychiatry: Judgement and insight appear normal. Flat affect    Data Reviewed: I have personally reviewed following labs and imaging studies  CBC: Recent Labs  Lab 11/20/17 0340 11/21/17 0942 11/22/17 0339 11/23/17 0424 11/24/17 0610  WBC 23.8* 25.2* 27.2* 26.4* 23.3*  NEUTROABS  --   --  20.9*  --   --   HGB 9.1* 9.6* 9.3* 8.5* 8.5*  HCT 26.3* 28.3* 27.2* 25.2* 25.4*  MCV 83.2 84.7 84.2 84.6 84.9  PLT 441* 504* 523* 551* 932*   Basic Metabolic Panel: Recent Labs  Lab 11/19/17 0421 11/20/17 0340 11/21/17 0942 11/22/17 0339 11/23/17 0424 11/24/17 0610  NA 135 134* 132* 134* 136  --   K 3.7 3.4* 3.1* 3.1* 3.1* 3.1*  CL 99* 101 100* 99* 106  --   CO2 24 24 24 24 22   --   GLUCOSE 307* 244* 296* 180* 178*  --   BUN 15 8 5* <5* <5*  --   CREATININE 1.04 0.95 0.86 0.75 0.81  --   CALCIUM 8.9 8.7* 8.6* 8.7* 8.3*  --    GFR: Estimated Creatinine Clearance: 170.8 mL/min (by C-G formula based on SCr of 0.81 mg/dL). Liver Function Tests: Recent Labs  Lab 11/18/17 0246 11/19/17 0421  AST 22 17  ALT 16* 15*  ALKPHOS 102 96  BILITOT 0.7 0.8  PROT 7.1 6.5  ALBUMIN 2.0* 1.7*   No results for input(s): LIPASE, AMYLASE in the last 168 hours. No results for input(s): AMMONIA in the last 168 hours. Coagulation Profile: No results for input(s): INR, PROTIME in the last 168 hours. Cardiac Enzymes: No results for input(s): CKTOTAL, CKMB, CKMBINDEX, TROPONINI in the last 168 hours. BNP (last 3 results) No results for input(s): PROBNP in the last 8760 hours. HbA1C: No results for input(s): HGBA1C in the last 72 hours. CBG: Recent Labs  Lab 11/23/17 1131 11/23/17 1650 11/23/17 2122 11/24/17 0748 11/24/17 1135  GLUCAP 144* 199* 196* 184* 133*   Lipid Profile: No results for input(s): CHOL, HDL, LDLCALC, TRIG, CHOLHDL, LDLDIRECT in the last 72 hours. Thyroid Function Tests: No results for input(s): TSH, T4TOTAL, FREET4, T3FREE, THYROIDAB in  the last 72 hours. Anemia Panel: No results for input(s): VITAMINB12, FOLATE, FERRITIN, TIBC, IRON, RETICCTPCT in the last 72 hours. Sepsis Labs: No results for input(s): PROCALCITON, LATICACIDVEN in the last 168 hours.  Recent Results (from the past 240 hour(s))  MRSA PCR Screening     Status: None   Collection Time: 11/16/17  1:51 PM  Result Value Ref Range Status   MRSA by PCR NEGATIVE NEGATIVE Final    Comment:        The GeneXpert MRSA Assay (FDA approved for NASAL specimens only), is one component of a comprehensive MRSA colonization surveillance program. It is not intended to diagnose MRSA infection nor to guide or monitor treatment for MRSA infections. Performed at [facility], [place] [address] [place], [place] [address]   Culture, blood (routine x 2)     Status: None   Collection Time: 11/16/17  1:55 PM  Result Value Ref Range Status   Specimen Description BLOOD HAND  Final   Special Requests   Final    BOTTLES DRAWN AEROBIC  AND ANAEROBIC Blood Culture results may not be optimal due to an inadequate volume of blood received in culture bottles   Culture   Final    NO GROWTH 5 DAYS Performed at [facility] [address], [place], [place] [address]    Report Status 11/21/2017 FINAL  Final  Culture, blood (routine x 2)     Status: None   Collection Time: 11/16/17  2:00 PM  Result Value Ref Range Status   Specimen Description BLOOD LEFT HAND  Final   Special Requests   Final    BOTTLES DRAWN AEROBIC ONLY Blood Culture adequate volume   Culture   Final    NO GROWTH 5 DAYS Performed at [facility], [place] [address], [place], [place] [address]    Report Status 11/21/2017 FINAL  Final  Surgical pcr screen     Status: None   Collection Time: 11/18/17 12:00 AM  Result Value Ref Range Status   MRSA, PCR NEGATIVE NEGATIVE Final   Staphylococcus aureus NEGATIVE NEGATIVE Final    Comment: (NOTE) The Xpert SA Assay (FDA approved for NASAL specimens in  patients 58 years of age and older), is one component of a comprehensive surveillance program. It is not intended to diagnose infection nor to guide or monitor treatment. Performed at [facility], [place] [address] [place], [place] [address]   C difficile quick scan w PCR reflex     Status: None   Collection Time: 11/20/17 11:40 AM  Result Value Ref Range Status   C Diff antigen NEGATIVE NEGATIVE Final   C Diff toxin NEGATIVE NEGATIVE Final   C Diff interpretation No C. difficile detected.  Final  Gastrointestinal Panel by PCR , Stool     Status: Abnormal   Collection Time: 11/20/17 11:40 AM  Result Value Ref Range Status   Campylobacter species NOT DETECTED NOT DETECTED Final   Plesimonas shigelloides NOT DETECTED NOT DETECTED Final   Salmonella species NOT DETECTED NOT DETECTED Final   Yersinia enterocolitica NOT DETECTED NOT DETECTED Final   Vibrio species NOT DETECTED NOT DETECTED Final   Vibrio cholerae NOT DETECTED NOT DETECTED Final   Enteroaggregative E coli (EAEC) DETECTED (A) NOT DETECTED Final    Comment: RESULT CALLED TO, READ BACK BY AND VERIFIED WITH: MELYNN MUELLER AT 1850 11/20/2017.  TFK    Enteropathogenic E coli (EPEC) NOT DETECTED NOT DETECTED Final   Enterotoxigenic E coli (ETEC) NOT DETECTED NOT DETECTED Final   Shiga like toxin producing E coli (STEC) NOT DETECTED NOT DETECTED Final   Shigella/Enteroinvasive E coli (EIEC) NOT DETECTED NOT DETECTED Final   Cryptosporidium NOT DETECTED NOT DETECTED Final   Cyclospora cayetanensis NOT DETECTED NOT DETECTED Final   Entamoeba histolytica NOT DETECTED NOT DETECTED Final   Giardia lamblia NOT DETECTED NOT DETECTED Final   Adenovirus F40/41 NOT DETECTED NOT DETECTED Final   Astrovirus NOT DETECTED NOT DETECTED Final   Norovirus GI/GII NOT DETECTED NOT DETECTED Final   Rotavirus A NOT DETECTED NOT DETECTED Final   Sapovirus (I, II, IV, and V) NOT DETECTED NOT DETECTED Final    Comment: Performed at [facility], [address] [place], [place] [address]         Radiology Studies: Dg Abd Portable 1v  Result Date: 11/24/2017 CLINICAL DATA:  Abdominal distention. EXAM: PORTABLE ABDOMEN - 1 VIEW COMPARISON:  11/23/2017. FINDINGS: Moderate distention of small and large bowel noted suggesting adynamic ileus. Follow-up exam suggested demonstrate resolution  and to exclude bowel obstruction. No free air. Degenerative changes lumbar spine with scoliosis concave left IMPRESSION: Marks tension is small and large bowel noted suggesting adynamic ileus. Follow-up exam suggested to demonstrate resolution and to exclude bowel obstruction. Electronically Signed   By: Marcello Moores  Register   On: 11/24/2017 11:04   Dg Abd Portable 1v  Result Date: 11/23/2017 CLINICAL DATA:  Abdominal distension EXAM: PORTABLE ABDOMEN - 1 VIEW COMPARISON:  None. FINDINGS: There is colonic dilatation. No appreciable small bowel dilatation. No free air evident. No abnormal calcifications. IMPRESSION: Colonic dilatation. Question colonic ileus versus a degree of distal obstruction. No free air evident. No appreciable small bowel dilatation. Electronically Signed   By: Lowella Grip III M.D.   On: 11/23/2017 14:14        Scheduled Meds: . chlorhexidine  15 mL Mouth Rinse BID  . feeding supplement (GLUCERNA SHAKE)  237 mL Oral BID BM  . gabapentin  300 mg Oral TID  . heparin  5,000 Units Subcutaneous Q8H  . insulin aspart  0-20 Units Subcutaneous TID WC  . insulin aspart  0-5 Units Subcutaneous QHS  . insulin aspart  6 Units Subcutaneous TID WC  . insulin glargine  40 Units Subcutaneous Q24H  . mouth rinse  15 mL Mouth Rinse q12n4p  . metoprolol tartrate  12.5 mg Oral BID  . potassium chloride  40 mEq Oral Q4H  . saccharomyces boulardii  250 mg Oral BID   Continuous Infusions: . methocarbamol (ROBAXIN)  IV    . sodium chloride       LOS: 8 days     Cordelia Poche, MD [facility] 11/24/2017, 1:51  PM Pager: [phone]  If 7PM-7AM, please contact night-coverage [email or web address] Password [email or web address] 11/24/2017, 1:51 PM

## 2017-11-24 NOTE — Social Work (Signed)
CSW continuing to follow to support disposition, pt will be placed with LOG at [facility] when medically appropriate.    [place], [place] Work [phone]

## 2017-11-25 LAB — GLUCOSE, CAPILLARY
GLUCOSE-CAPILLARY: 172 mg/dL — AB (ref 65–99)
GLUCOSE-CAPILLARY: 304 mg/dL — AB (ref 65–99)
GLUCOSE-CAPILLARY: 270 mg/dL — AB (ref 65–99)
GLUCOSE-CAPILLARY: 154 mg/dL — AB (ref 65–99)
GLUCOSE-CAPILLARY: 153 mg/dL — AB (ref 65–99)

## 2017-11-25 LAB — POTASSIUM: POTASSIUM: 3.4 mmol/L — AB (ref 3.5–5.1)

## 2017-11-25 MED ORDER — POTASSIUM CHLORIDE CRYS ER 20 MEQ PO TBCR
40.0000 meq | EXTENDED_RELEASE_TABLET | ORAL | Status: AC
  Administered 2017-11-25 (×2): 40 meq via ORAL
  Filled 2017-11-25 (×2): qty 2

## 2017-11-25 NOTE — Progress Notes (Signed)
PROGRESS NOTE    Ian Bond  OJJ:[id number] DOB: Feb 10, 1976 DOA: 11/16/2017 PCP: Patient, No Pcp Per   Brief Narrative: Ian Bond is a 42 y.o. male with a history of obesity.  He presented secondary to nausea, vomiting, and dyspnea and was found to be in DKA.  He was treated for DKA with insulin drip was transitioned to subcutaneous insulin.  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  He also developed diarrhea, found to have bacterial gastroenteritis.   Assessment & Plan:   Principal Problem:   DKA (diabetic ketoacidoses) ([place]) Active Problems:   Diabetic infection of left foot ([place])   Diabetic infection of right foot ([place])   Gangrene of left foot ([place])   Gangrene of right foot ([place])   DKA The setting of newly diagnosed diabetes mellitus.  Hemoglobin A1c of 12.2%.  Patient treated with IV insulin and was transitioned to subcutaneous insulin.  Resolved  Diabetes mellitus, type II, uncontrolled with hyperglycemia As mentioned above, patient presented in DKA.  Patient is transition to subcutaneous Lantus and NovoLog.  Fasting blood sugar adequately controlled. -Continue Lantus to 40 units nightly -Continue resistant sliding scale and continue nightly dosing -Continue to NovoLog 6 units 3 times daily with meals  Gangrene of right and left foot Diabetic infection of bilateral feet Patient is status post bilateral transmetatarsal amputation on 4/27 by orthopedic surgery.  Patient is currently weightbearing on heels for transfers only.  No gait training. -Orthopedic surgery recommendations  Gastroenteritis GI pathogen panel significant for EAEC.  Patient was started on ciprofloxacin for treatment.  Symptoms appear mild. Discontinued ciprofloxacin. Still with symptoms. Self limiting. Expect course to run 5-7 days before improvement and 7-10 days before resolution.  Leukocytosis Likely reactive secondary to  acute GI infection.  Foot wounds appear to uninfected.  No associated fevers. Improving.  Thrombocytosis Also likely reactive.  Acute kidney injury Resolved  Ileus Seen on x-ray. Patient with watery stools. Seems to be improving. -Full liquid diet -Will obtain x-ray/advance diet tomorrow  Ethanol abuse No withdrawal symptoms.  Obesity Body mass index is 33.92 kg/m.   DVT prophylaxis: Heparin subcutaneous Code Status:   Code Status: Full Code Family Communication: Fiance at bedside Disposition Plan: Discharge to SNF when medically ready for discharge   Consultants:   Orthopedic surgery  Procedures:   Bilateral transmetatarsal amputation (4/27)  Antimicrobials:  Vancomycin (4/25>>4/29)  Zosyn (4/25>>4/29)  Ciprofloxacin (4/29>>5/1)   Subjective: Continues to pass gas. Watery stools. No abdominal pain.  Objective: Vitals:   11/24/17 1953 11/25/17 0525 11/25/17 0808 11/25/17 1437  BP: 133/72 139/82 (!) 140/93 136/87  Pulse: (!) 102 87 85 92  Resp: 19 19  18   Temp: 98.6 F (37 C) 98.6 F (37 C)  98.6 F (37 C)  TempSrc: Oral Oral  Oral  SpO2: 100% 100%  94%  Weight:  123.1 kg (271 lb 6.4 oz)    Height:        Intake/Output Summary (Last 24 hours) at 11/25/2017 1444 Last data filed at 11/25/2017 1300 Gross per 24 hour  Intake 720 ml  Output 1650 ml  Net -930 ml   Filed Weights   11/23/17 0632 11/24/17 0547 11/25/17 0525  Weight: 125.6 kg (276 lb 14.4 oz) 130.1 kg (286 lb 13.1 oz) 123.1 kg (271 lb 6.4 oz)    Examination:  General exam: Appears calm and comfortable Respiratory system: Clear to auscultation. Respiratory effort normal. Cardiovascular system: S1 & S2 heard,  RRR. No murmur. Gastrointestinal system: Abdomen is distended but less so than yesterday, soft and nontender. No organomegaly or masses felt. Better bowel sounds heard today Central nervous system: Alert and oriented. No focal neurological deficits. Extremities: No calf  tenderness. Bilateral transmetatarsal amputations with intact dressing and some old dried blood on dressing. Skin: No cyanosis. No rashes Psychiatry: Judgement and insight appear normal. Mood & affect appropriate.   Data Reviewed: I have personally reviewed following labs and imaging studies  CBC: Recent Labs  Lab 11/20/17 0340 11/21/17 0942 11/22/17 0339 11/23/17 0424 11/24/17 0610  WBC 23.8* 25.2* 27.2* 26.4* 23.3*  NEUTROABS  --   --  20.9*  --   --   HGB 9.1* 9.6* 9.3* 8.5* 8.5*  HCT 26.3* 28.3* 27.2* 25.2* 25.4*  MCV 83.2 84.7 84.2 84.6 84.9  PLT 441* 504* 523* 551* 778*   Basic Metabolic Panel: Recent Labs  Lab 11/19/17 0421 11/20/17 0340 11/21/17 0942 11/22/17 0339 11/23/17 0424 11/24/17 0610 11/25/17 1034  NA 135 134* 132* 134* 136  --   --   K 3.7 3.4* 3.1* 3.1* 3.1* 3.1* 3.4*  CL 99* 101 100* 99* 106  --   --   CO2 24 24 24 24 22   --   --   GLUCOSE 307* 244* 296* 180* 178*  --   --   BUN 15 8 5* <5* <5*  --   --   CREATININE 1.04 0.95 0.86 0.75 0.81  --   --   CALCIUM 8.9 8.7* 8.6* 8.7* 8.3*  --   --    GFR: Estimated Creatinine Clearance: 166.2 mL/min (by C-G formula based on SCr of 0.81 mg/dL). Liver Function Tests: Recent Labs  Lab 11/19/17 0421  AST 17  ALT 15*  ALKPHOS 96  BILITOT 0.8  PROT 6.5  ALBUMIN 1.7*   No results for input(s): LIPASE, AMYLASE in the last 168 hours. No results for input(s): AMMONIA in the last 168 hours. Coagulation Profile: No results for input(s): INR, PROTIME in the last 168 hours. Cardiac Enzymes: No results for input(s): CKTOTAL, CKMB, CKMBINDEX, TROPONINI in the last 168 hours. BNP (last 3 results) No results for input(s): PROBNP in the last 8760 hours. HbA1C: No results for input(s): HGBA1C in the last 72 hours. CBG: Recent Labs  Lab 11/24/17 1803 11/24/17 2149 11/25/17 0740 11/25/17 1013 11/25/17 1147  GLUCAP 151* 129* 172* 304* 270*   Lipid Profile: No results for input(s): CHOL, HDL, LDLCALC,  TRIG, CHOLHDL, LDLDIRECT in the last 72 hours. Thyroid Function Tests: No results for input(s): TSH, T4TOTAL, FREET4, T3FREE, THYROIDAB in the last 72 hours. Anemia Panel: No results for input(s): VITAMINB12, FOLATE, FERRITIN, TIBC, IRON, RETICCTPCT in the last 72 hours. Sepsis Labs: No results for input(s): PROCALCITON, LATICACIDVEN in the last 168 hours.  Recent Results (from the past 240 hour(s))  MRSA PCR Screening     Status: None   Collection Time: 11/16/17  1:51 PM  Result Value Ref Range Status   MRSA by PCR NEGATIVE NEGATIVE Final    Comment:        The GeneXpert MRSA Assay (FDA approved for NASAL specimens only), is one component of a comprehensive MRSA colonization surveillance program. It is not intended to diagnose MRSA infection nor to guide or monitor treatment for MRSA infections. Performed at [facility], [place] [address] [place], [place] [address]   Culture, blood (routine x 2)     Status: None   Collection Time: 11/16/17  1:55 PM  Result Value Ref Range Status   Specimen Description BLOOD HAND  Final   Special Requests   Final    BOTTLES DRAWN AEROBIC AND ANAEROBIC Blood Culture results may not be optimal due to an inadequate volume of blood received in culture bottles   Culture   Final    NO GROWTH 5 DAYS Performed at [facility] [address] [place], [place] [address]    Report Status 11/21/2017 FINAL  Final  Culture, blood (routine x 2)     Status: None   Collection Time: 11/16/17  2:00 PM  Result Value Ref Range Status   Specimen Description BLOOD LEFT HAND  Final   Special Requests   Final    BOTTLES DRAWN AEROBIC ONLY Blood Culture adequate volume   Culture   Final    NO GROWTH 5 DAYS Performed at [facility], [place] [address], [place], [place] [address]    Report Status 11/21/2017 FINAL  Final  Surgical pcr screen     Status: None   Collection Time: 11/18/17 12:00 AM  Result Value Ref Range Status   MRSA, PCR NEGATIVE  NEGATIVE Final   Staphylococcus aureus NEGATIVE NEGATIVE Final    Comment: (NOTE) The Xpert SA Assay (FDA approved for NASAL specimens in patients 72 years of age and older), is one component of a comprehensive surveillance program. It is not intended to diagnose infection nor to guide or monitor treatment. Performed at [facility], [place] [address] [place], [place] [address]   C difficile quick scan w PCR reflex     Status: None   Collection Time: 11/20/17 11:40 AM  Result Value Ref Range Status   C Diff antigen NEGATIVE NEGATIVE Final   C Diff toxin NEGATIVE NEGATIVE Final   C Diff interpretation No C. difficile detected.  Final  Gastrointestinal Panel by PCR , Stool     Status: Abnormal   Collection Time: 11/20/17 11:40 AM  Result Value Ref Range Status   Campylobacter species NOT DETECTED NOT DETECTED Final   Plesimonas shigelloides NOT DETECTED NOT DETECTED Final   Salmonella species NOT DETECTED NOT DETECTED Final   Yersinia enterocolitica NOT DETECTED NOT DETECTED Final   Vibrio species NOT DETECTED NOT DETECTED Final   Vibrio cholerae NOT DETECTED NOT DETECTED Final   Enteroaggregative E coli (EAEC) DETECTED (A) NOT DETECTED Final    Comment: RESULT CALLED TO, READ BACK BY AND VERIFIED WITH: MELYNN MUELLER AT 1850 11/20/2017.  TFK    Enteropathogenic E coli (EPEC) NOT DETECTED NOT DETECTED Final   Enterotoxigenic E coli (ETEC) NOT DETECTED NOT DETECTED Final   Shiga like toxin producing E coli (STEC) NOT DETECTED NOT DETECTED Final   Shigella/Enteroinvasive E coli (EIEC) NOT DETECTED NOT DETECTED Final   Cryptosporidium NOT DETECTED NOT DETECTED Final   Cyclospora cayetanensis NOT DETECTED NOT DETECTED Final   Entamoeba histolytica NOT DETECTED NOT DETECTED Final   Giardia lamblia NOT DETECTED NOT DETECTED Final   Adenovirus F40/41 NOT DETECTED NOT DETECTED Final   Astrovirus NOT DETECTED NOT DETECTED Final   Norovirus GI/GII NOT DETECTED NOT DETECTED Final    Rotavirus A NOT DETECTED NOT DETECTED Final   Sapovirus (I, II, IV, and V) NOT DETECTED NOT DETECTED Final    Comment: Performed at [facility], [address], [place], [place] [address]         Radiology Studies: Dg Abd Portable 1v  Result Date: 11/24/2017 CLINICAL DATA:  Abdominal distention. EXAM: PORTABLE ABDOMEN - 1 VIEW COMPARISON:  11/23/2017. FINDINGS: Moderate distention of small and large bowel noted suggesting adynamic ileus. Follow-up exam suggested demonstrate resolution and to exclude bowel obstruction. No free air. Degenerative changes lumbar spine with scoliosis concave left IMPRESSION: Marks tension is small and large bowel noted suggesting adynamic ileus. Follow-up exam suggested to demonstrate resolution and to exclude bowel obstruction. Electronically Signed   By: [place]   On: 11/24/2017 11:04        Scheduled Meds: . chlorhexidine  15 mL Mouth Rinse BID  . feeding supplement (GLUCERNA SHAKE)  237 mL Oral BID BM  . gabapentin  300 mg Oral TID  . heparin  5,000 Units Subcutaneous Q8H  . insulin aspart  0-20 Units Subcutaneous TID WC  . insulin aspart  0-5 Units Subcutaneous QHS  . insulin aspart  6 Units Subcutaneous TID WC  . insulin glargine  40 Units Subcutaneous Q24H  . mouth rinse  15 mL Mouth Rinse q12n4p  . metoprolol tartrate  12.5 mg Oral BID  . saccharomyces boulardii  250 mg Oral BID   Continuous Infusions: . methocarbamol (ROBAXIN)  IV    . sodium chloride       LOS: 9 days     Cordelia Poche, MD [facility] 11/25/2017, 2:44 PM Pager: [phone]  If 7PM-7AM, please contact night-coverage [email or web address] Password [email or web address] 11/25/2017, 2:44 PM

## 2017-11-26 ENCOUNTER — Inpatient Hospital Stay (HOSPITAL_COMMUNITY): Payer: Medicaid Other

## 2017-11-26 LAB — GLUCOSE, CAPILLARY
GLUCOSE-CAPILLARY: 151 mg/dL — AB (ref 65–99)
GLUCOSE-CAPILLARY: 172 mg/dL — AB (ref 65–99)
Glucose-Capillary: 154 mg/dL — ABNORMAL HIGH (ref 65–99)
Glucose-Capillary: 121 mg/dL — ABNORMAL HIGH (ref 65–99)

## 2017-11-26 LAB — CBC
HEMATOCRIT: 24.8 % — AB (ref 39.0–52.0)
Hemoglobin: 8.3 g/dL — ABNORMAL LOW (ref 13.0–17.0)
MCH: 28.3 pg (ref 26.0–34.0)
MCHC: 33.5 g/dL (ref 30.0–36.0)
MCV: 84.6 fL (ref 78.0–100.0)
Platelets: 710 10*3/uL — ABNORMAL HIGH (ref 150–400)
RBC: 2.93 MIL/uL — AB (ref 4.22–5.81)
RDW: 13 % (ref 11.5–15.5)
WBC: 17.8 10*3/uL — AB (ref 4.0–10.5)

## 2017-11-26 LAB — BASIC METABOLIC PANEL
ANION GAP: 9 (ref 5–15)
BUN: <5 mg/dL — ABNORMAL LOW (ref 6–20)
CO2: 25 mmol/L (ref 22–32)
Calcium: 8.5 mg/dL — ABNORMAL LOW (ref 8.9–10.3)
Chloride: 102 mmol/L (ref 101–111)
Creatinine, Ser: 0.72 mg/dL (ref 0.61–1.24)
Glucose, Bld: 187 mg/dL — ABNORMAL HIGH (ref 65–99)
Potassium: 3.2 mmol/L — ABNORMAL LOW (ref 3.5–5.1)
Sodium: 136 mmol/L (ref 135–145)

## 2017-11-26 MED ORDER — POLYETHYLENE GLYCOL 3350 17 G PO PACK
17.0000 g | PACK | Freq: Every day | ORAL | Status: DC
  Filled 2017-11-26: qty 1

## 2017-11-26 NOTE — Progress Notes (Signed)
PROGRESS NOTE    Ian Bond  ZYS:[id number] DOB: May 11, 1976 DOA: 11/16/2017 PCP: Patient, No Pcp Per   Brief Narrative: Ian Bond is a 42 y.o. male with a history of obesity.  He presented secondary to nausea, vomiting, and dyspnea and was found to be in DKA.  He was treated for DKA with insulin drip was transitioned to subcutaneous insulin.  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  He also developed diarrhea, found to have bacterial gastroenteritis. Now with ileus, which is resolving.   Assessment & Plan:   Principal Problem:   DKA (diabetic ketoacidoses) ([place]) Active Problems:   Diabetic infection of left foot ([place])   Diabetic infection of right foot ([place])   Gangrene of left foot ([place])   Gangrene of right foot ([place])   DKA The setting of newly diagnosed diabetes mellitus.  Hemoglobin A1c of 12.2%.  Patient treated with IV insulin and was transitioned to subcutaneous insulin.  Resolved  Diabetes mellitus, type II, uncontrolled with hyperglycemia As mentioned above, patient presented in DKA.  Patient is transition to subcutaneous Lantus and NovoLog.  Fasting blood sugar adequately controlled. -Continue Lantus to 40 units nightly -Continue resistant sliding scale and continue nightly dosing -Continue to NovoLog 6 units 3 times daily with meals  Gangrene of right and left foot Diabetic infection of bilateral feet Patient is status post bilateral transmetatarsal amputation on 4/27 by orthopedic surgery.  Patient is currently weightbearing on heels for transfers only.  No gait training. -Orthopedic surgery recommendations  Gastroenteritis GI pathogen panel significant for EAEC.  Patient was started on ciprofloxacin for treatment.  Symptoms appear mild. Discontinued ciprofloxacin. Still with symptoms but improving. Self limiting. Expect course to run 5-7 days before improvement and 7-10 days before  resolution.  Leukocytosis Likely reactive secondary to acute GI infection.  Foot wounds appear to uninfected.  No associated fevers. Continues to improve.  Thrombocytosis Also likely reactive. Still rising. Watch intermittently.  Acute kidney injury Resolved  Ileus Seen on x-ray. Patient with watery stools. Seems to be improving. -Carb/heart diet -Repeat abdominal x-ray in AM  Ethanol abuse No withdrawal symptoms.  Obesity Body mass index is 34.11 kg/m.   DVT prophylaxis: Heparin subcutaneous Code Status:   Code Status: Full Code Family Communication: Fiance at bedside Disposition Plan: Discharge to SNF when ileus resolved   Consultants:   Orthopedic surgery  Procedures:   Bilateral transmetatarsal amputation (4/27)  Antimicrobials:  Vancomycin (4/25>>4/29)  Zosyn (4/25>>4/29)  Ciprofloxacin (4/29>>5/1)   Subjective: Passing gas. Less diarrhea overnight. No abdominal pain.  Objective: Vitals:   11/25/17 1437 11/25/17 1949 11/26/17 0333 11/26/17 1006  BP: 136/87 132/85 125/80 129/85  Pulse: 92 94 86 92  Resp: 18 (!) 22 (!) 23   Temp: 98.6 F (37 C) 99.6 F (37.6 C) 98.9 F (37.2 C)   TempSrc: Oral Oral Oral   SpO2: 94% 100% 99%   Weight:   123.8 kg (272 lb 14.9 oz)   Height:        Intake/Output Summary (Last 24 hours) at 11/26/2017 1105 Last data filed at 11/26/2017 0540 Gross per 24 hour  Intake 954 ml  Output 2350 ml  Net -1396 ml   Filed Weights   11/24/17 0547 11/25/17 0525 11/26/17 0333  Weight: 130.1 kg (286 lb 13.1 oz) 123.1 kg (271 lb 6.4 oz) 123.8 kg (272 lb 14.9 oz)    Examination:  General exam: Appears calm and comfortable  Respiratory system: Clear  to auscultation. Respiratory effort normal. Cardiovascular system: S1 & S2 heard, RRR. No murmurs, rubs, gallops or clicks. Gastrointestinal system: Abdomen is distended but improved, soft and nontender. No organomegaly or masses felt. Normal bowel sounds heard. Central nervous  system: Alert and oriented. No focal neurological deficits. Extremities: No edema. No calf tenderness. Bilateral transmetatarsal amputations with dressing intact Skin: No cyanosis. No rashes Psychiatry: Judgement and insight appear normal. Mood & affect appropriate.   Data Reviewed: I have personally reviewed following labs and imaging studies  CBC: Recent Labs  Lab 11/21/17 0942 11/22/17 0339 11/23/17 0424 11/24/17 0610 11/26/17 0322  WBC 25.2* 27.2* 26.4* 23.3* 17.8*  NEUTROABS  --  20.9*  --   --   --   HGB 9.6* 9.3* 8.5* 8.5* 8.3*  HCT 28.3* 27.2* 25.2* 25.4* 24.8*  MCV 84.7 84.2 84.6 84.9 84.6  PLT 504* 523* 551* 599* 338*   Basic Metabolic Panel: Recent Labs  Lab 11/20/17 0340 11/21/17 0942 11/22/17 0339 11/23/17 0424 11/24/17 0610 11/25/17 1034 11/26/17 0322  NA 134* 132* 134* 136  --   --  136  K 3.4* 3.1* 3.1* 3.1* 3.1* 3.4* 3.2*  CL 101 100* 99* 106  --   --  102  CO2 24 24 24 22   --   --  25  GLUCOSE 244* 296* 180* 178*  --   --  187*  BUN 8 5* <5* <5*  --   --  <5*  CREATININE 0.95 0.86 0.75 0.81  --   --  0.72  CALCIUM 8.7* 8.6* 8.7* 8.3*  --   --  8.5*   GFR: Estimated Creatinine Clearance: 168.7 mL/min (by C-G formula based on SCr of 0.72 mg/dL). Liver Function Tests: No results for input(s): AST, ALT, ALKPHOS, BILITOT, PROT, ALBUMIN in the last 168 hours. No results for input(s): LIPASE, AMYLASE in the last 168 hours. No results for input(s): AMMONIA in the last 168 hours. Coagulation Profile: No results for input(s): INR, PROTIME in the last 168 hours. Cardiac Enzymes: No results for input(s): CKTOTAL, CKMB, CKMBINDEX, TROPONINI in the last 168 hours. BNP (last 3 results) No results for input(s): PROBNP in the last 8760 hours. HbA1C: No results for input(s): HGBA1C in the last 72 hours. CBG: Recent Labs  Lab 11/25/17 1013 11/25/17 1147 11/25/17 1652 11/25/17 2029 11/26/17 0800  GLUCAP 304* 270* 154* 153* 154*   Lipid Profile: No  results for input(s): CHOL, HDL, LDLCALC, TRIG, CHOLHDL, LDLDIRECT in the last 72 hours. Thyroid Function Tests: No results for input(s): TSH, T4TOTAL, FREET4, T3FREE, THYROIDAB in the last 72 hours. Anemia Panel: No results for input(s): VITAMINB12, FOLATE, FERRITIN, TIBC, IRON, RETICCTPCT in the last 72 hours. Sepsis Labs: No results for input(s): PROCALCITON, LATICACIDVEN in the last 168 hours.  Recent Results (from the past 240 hour(s))  MRSA PCR Screening     Status: None   Collection Time: 11/16/17  1:51 PM  Result Value Ref Range Status   MRSA by PCR NEGATIVE NEGATIVE Final    Comment:        The GeneXpert MRSA Assay (FDA approved for NASAL specimens only), is one component of a comprehensive MRSA colonization surveillance program. It is not intended to diagnose MRSA infection nor to guide or monitor treatment for MRSA infections. Performed at [facility], [place] [address], [place], [place] [address]   Culture, blood (routine x 2)     Status: None   Collection Time: 11/16/17  1:55 PM  Result Value  Ref Range Status   Specimen Description BLOOD HAND  Final   Special Requests   Final    BOTTLES DRAWN AEROBIC AND ANAEROBIC Blood Culture results may not be optimal due to an inadequate volume of blood received in culture bottles   Culture   Final    NO GROWTH 5 DAYS Performed at [facility], [place] [address], [place], [place] [address]    Report Status 11/21/2017 FINAL  Final  Culture, blood (routine x 2)     Status: None   Collection Time: 11/16/17  2:00 PM  Result Value Ref Range Status   Specimen Description BLOOD LEFT HAND  Final   Special Requests   Final    BOTTLES DRAWN AEROBIC ONLY Blood Culture adequate volume   Culture   Final    NO GROWTH 5 DAYS Performed at [facility], [place] [address], [place], [place] [address]    Report Status 11/21/2017 FINAL  Final  Surgical pcr screen     Status: None   Collection Time: 11/18/17 12:00 AM  Result  Value Ref Range Status   MRSA, PCR NEGATIVE NEGATIVE Final   Staphylococcus aureus NEGATIVE NEGATIVE Final    Comment: (NOTE) The Xpert SA Assay (FDA approved for NASAL specimens in patients 6 years of age and older), is one component of a comprehensive surveillance program. It is not intended to diagnose infection nor to guide or monitor treatment. Performed at [facility], [place] [address], [place], [place] [address]   C difficile quick scan w PCR reflex     Status: None   Collection Time: 11/20/17 11:40 AM  Result Value Ref Range Status   C Diff antigen NEGATIVE NEGATIVE Final   C Diff toxin NEGATIVE NEGATIVE Final   C Diff interpretation No C. difficile detected.  Final  Gastrointestinal Panel by PCR , Stool     Status: Abnormal   Collection Time: 11/20/17 11:40 AM  Result Value Ref Range Status   Campylobacter species NOT DETECTED NOT DETECTED Final   Plesimonas shigelloides NOT DETECTED NOT DETECTED Final   Salmonella species NOT DETECTED NOT DETECTED Final   Yersinia enterocolitica NOT DETECTED NOT DETECTED Final   Vibrio species NOT DETECTED NOT DETECTED Final   Vibrio cholerae NOT DETECTED NOT DETECTED Final   Enteroaggregative E coli (EAEC) DETECTED (A) NOT DETECTED Final    Comment: RESULT CALLED TO, READ BACK BY AND VERIFIED WITH: MELYNN MUELLER AT 1850 11/20/2017.  TFK    Enteropathogenic E coli (EPEC) NOT DETECTED NOT DETECTED Final   Enterotoxigenic E coli (ETEC) NOT DETECTED NOT DETECTED Final   Shiga like toxin producing E coli (STEC) NOT DETECTED NOT DETECTED Final   Shigella/Enteroinvasive E coli (EIEC) NOT DETECTED NOT DETECTED Final   Cryptosporidium NOT DETECTED NOT DETECTED Final   Cyclospora cayetanensis NOT DETECTED NOT DETECTED Final   Entamoeba histolytica NOT DETECTED NOT DETECTED Final   Giardia lamblia NOT DETECTED NOT DETECTED Final   Adenovirus F40/41 NOT DETECTED NOT DETECTED Final   Astrovirus NOT DETECTED NOT DETECTED Final   Norovirus  GI/GII NOT DETECTED NOT DETECTED Final   Rotavirus A NOT DETECTED NOT DETECTED Final   Sapovirus (I, II, IV, and V) NOT DETECTED NOT DETECTED Final    Comment: Performed at [facility], [address], [place], [place] [address]         Radiology Studies: Dg Abd Portable 1v  Result Date: 11/26/2017 CLINICAL DATA:  Ileus. EXAM: PORTABLE ABDOMEN -  1 VIEW COMPARISON:  11/24/2017 FINDINGS: Improving ileus involving predominantly the colon with the mid transverse colon now measuring approximately 7.5 cm compared to 9.5 cm on the prior study. No significant small bowel dilatation. No gross signs of free air or pneumatosis. IMPRESSION: Improving colonic ileus. Electronically Signed   By: Aletta Edouard M.D.   On: 11/26/2017 09:18        Scheduled Meds: . chlorhexidine  15 mL Mouth Rinse BID  . feeding supplement (GLUCERNA SHAKE)  237 mL Oral BID BM  . gabapentin  300 mg Oral TID  . heparin  5,000 Units Subcutaneous Q8H  . insulin aspart  0-20 Units Subcutaneous TID WC  . insulin aspart  0-5 Units Subcutaneous QHS  . insulin aspart  6 Units Subcutaneous TID WC  . insulin glargine  40 Units Subcutaneous Q24H  . mouth rinse  15 mL Mouth Rinse q12n4p  . metoprolol tartrate  12.5 mg Oral BID  . polyethylene glycol  17 g Oral Daily  . saccharomyces boulardii  250 mg Oral BID   Continuous Infusions: . methocarbamol (ROBAXIN)  IV    . sodium chloride       LOS: 10 days     Cordelia Poche, MD [facility] 11/26/2017, 11:05 AM Pager: [phone]  If 7PM-7AM, please contact night-coverage [email or web address] Password [email or web address] 11/26/2017, 11:05 AM

## 2017-11-27 ENCOUNTER — Ambulatory Visit: Payer: Self-pay | Admitting: Family Medicine

## 2017-11-27 ENCOUNTER — Inpatient Hospital Stay (HOSPITAL_COMMUNITY): Payer: Medicaid Other

## 2017-11-27 DIAGNOSIS — K567 Ileus, unspecified: Secondary | ICD-10-CM

## 2017-11-27 LAB — GLUCOSE, CAPILLARY
GLUCOSE-CAPILLARY: 98 mg/dL (ref 65–99)
GLUCOSE-CAPILLARY: 117 mg/dL — AB (ref 65–99)
Glucose-Capillary: 173 mg/dL — ABNORMAL HIGH (ref 65–99)

## 2017-11-27 MED ORDER — OXYCODONE HCL 5 MG PO TABS: 5.0000 mg | ORAL_TABLET | ORAL | 0 refills | Status: AC | PRN

## 2017-11-27 MED ORDER — INSULIN GLARGINE 100 UNIT/ML ~~LOC~~ SOLN: 40.0000 [IU] | Freq: Every day | SUBCUTANEOUS | Status: AC

## 2017-11-27 MED ORDER — GLUCERNA SHAKE PO LIQD: 237.0000 mL | Freq: Two times a day (BID) | ORAL | 0 refills | Status: AC

## 2017-11-27 MED ORDER — INSULIN ASPART 100 UNIT/ML ~~LOC~~ SOLN: 6.0000 [IU] | Freq: Three times a day (TID) | SUBCUTANEOUS | Status: AC

## 2017-11-27 MED ORDER — POLYETHYLENE GLYCOL 3350 17 G PO PACK: 17.0000 g | PACK | Freq: Every day | ORAL | 0 refills | Status: AC

## 2017-11-27 MED ORDER — METOPROLOL TARTRATE 25 MG PO TABS: 12.5000 mg | ORAL_TABLET | Freq: Two times a day (BID) | ORAL | Status: AC

## 2017-11-27 MED ORDER — SENNA 8.6 MG PO TABS: 1.0000 | ORAL_TABLET | Freq: Every day | ORAL | Status: AC

## 2017-11-27 MED ORDER — GABAPENTIN 300 MG PO CAPS: 300.0000 mg | ORAL_CAPSULE | Freq: Three times a day (TID) | ORAL | Status: DC

## 2017-11-27 NOTE — Progress Notes (Signed)
Report given to nurse Conley Rolls, RN at [facility]. All questions were answered.  Ferdinand Lango, RN

## 2017-11-27 NOTE — Progress Notes (Signed)
Physical Therapy Treatment Patient Details Name: Ian Bond MRN: [id number] DOB: Jun 08, 1976 Today's Date: 11/27/2017    History of Present Illness 42yo obese M who has not sought medical care in years, and was admitted with nausea/vomiting and shortness of breath.  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 Exam revealed foul-smelling necrotic toes on both feet.  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    PT Comments    Pt admitted with above diagnosis. Pt currently with functional limitations due to balance and endurance deficits. Pt was able to scoot to drop arm recliner with min guard assist without physical assist needed.  Pt to go to SNF to heal and continue therapy.  Pt will benefit from skilled PT to increase their independence and safety with mobility to allow discharge to the venue listed below.     Follow Up Recommendations  Supervision/Assistance - 24 hour;SNF     Equipment Recommendations  Wheelchair (measurements PT);Wheelchair cushion (measurements PT);3in1 (PT);Other (comment);Rolling walker with 5" wheels    Recommendations for Other Services       Precautions / Restrictions Precautions Precautions: Fall Required Braces or Orthoses: Other Brace/Splint Other Brace/Splint: bil post op shoes Restrictions Weight Bearing Restrictions: No RLE Weight Bearing: Weight bearing as tolerated LLE Weight Bearing: Weight bearing as tolerated Other Position/Activity Restrictions: for transfers to wheelchair only    Mobility  Bed Mobility Overal bed mobility: Modified Independent             General bed mobility comments: On arrival, pt was on bedpan.  Assisted pt off bedpan.    Transfers Overall transfer level: Needs assistance   Transfers: Lateral/Scoot Transfers          Lateral/Scoot Transfers: Min guard General transfer comment: No difficulties with scoot pivot to drop arm recliner.   Ambulation/Gait             General Gait  Details: not permitted   Stairs             Wheelchair Mobility    Modified Rankin (Stroke Patients Only)       Balance Overall balance assessment: Needs assistance Sitting-balance support: Feet supported Sitting balance-Leahy Scale: Good                                      Cognition Arousal/Alertness: Awake/alert Behavior During Therapy: WFL for tasks assessed/performed Overall Cognitive Status: Within Functional Limits for tasks assessed                                        Exercises General Exercises - Lower Extremity Ankle Circles/Pumps: AROM;Both;10 reps;Seated Long Arc Quad: AROM;Both;10 reps;Seated    General Comments        Pertinent Vitals/Pain Pain Assessment: Faces Faces Pain Scale: Hurts a little bit Pain Location: feet Pain Descriptors / Indicators: Operative site guarding Pain Intervention(s): Limited activity within patient's tolerance;Monitored during session;Premedicated before session;Repositioned    Home Living                      Prior Function            PT Goals (current goals can now be found in the care plan section) Acute Rehab PT Goals Patient Stated Goal: get stronger  Progress towards PT goals:  Progressing toward goals    Frequency    Min 3X/week      PT Plan Current plan remains appropriate    Co-evaluation              AM-PAC PT "6 Clicks" Daily Activity  Outcome Measure  Difficulty turning over in bed (including adjusting bedclothes, sheets and blankets)?: None Difficulty moving from lying on back to sitting on the side of the bed? : A Little Difficulty sitting down on and standing up from a chair with arms (e.g., wheelchair, bedside commode, etc,.)?: A Little Help needed moving to and from a bed to chair (including a wheelchair)?: A Lot Help needed walking in hospital room?: Total Help needed climbing 3-5 steps with a railing? : Total 6 Click Score: 14     End of Session Equipment Utilized During Treatment: Gait belt Activity Tolerance: Patient limited by fatigue;Patient limited by pain Patient left: in chair;with call bell/phone within reach Nurse Communication: Mobility status PT Visit Diagnosis: Unsteadiness on feet (R26.81);Muscle weakness (generalized) (M62.81);Pain Pain - Right/Left: (Bilateral) Pain - part of body: Ankle and joints of foot     Time: 2876-8115 PT Time Calculation (min) (ACUTE ONLY): 27 min  Charges:  $Therapeutic Activity: 8-22 mins $Self Care/Home Management: 8-22                    G Codes:       Ian Bond,PT Acute Rehabilitation [phone] (pager)    Ian Bond 11/27/2017, 1:17 PM

## 2017-11-27 NOTE — Clinical Social Work Placement (Signed)
   CLINICAL SOCIAL WORK PLACEMENT  NOTE  Date:  11/27/2017  Patient Details  Name: Ian Bond MRN: [id number] Date of Birth: Aug 23, 1975  Clinical Social Work is seeking post-discharge placement for this patient at the [place] level of care (*CSW will initial, date and re-position this form in  chart as items are completed):  No(pt under LOG)   Patient/family provided with [facility] Work Department's list of facilities offering this level of care within the geographic area requested by the patient (or if unable, by the patient's family).  Yes   Patient/family informed of their freedom to choose among providers that offer the needed level of care, that participate in Medicare, Medicaid or managed care program needed by the patient, have an available bed and are willing to accept the patient.  No   Patient/family informed of [facility]'s ownership interest in [facility] and [facility], as well as of the fact that they are under no obligation to receive care at these facilities.  PASRR submitted to EDS on       PASRR number received on 11/20/17     Existing PASRR number confirmed on       FL2 transmitted to all facilities in geographic area requested by pt/family on 11/20/17     FL2 transmitted to all facilities within larger geographic area on       Patient informed that his/her managed care company has contracts with or will negotiate with certain facilities, including the following:  Other - please specify in the comment section below:([facility])     Yes   Patient/family informed of bed offers received.  Patient chooses bed at [facility])     Physician recommends and patient chooses bed at      Patient to be transferred to [facility]) on 11/27/17.  Patient to be transferred to facility by [facility]     Patient family notified on  11/27/17 of transfer.  Name of family member notified:        PHYSICIAN Please prepare priority discharge summary, including medications, Please prepare prescriptions     Additional Comment:    _______________________________________________ Estanislado Emms, LCSW 11/27/2017, 2:40 PM

## 2017-11-27 NOTE — Progress Notes (Addendum)
2 week LOG has been approved for patient by [facility] clinical supervisor. LOG accepted by [facility] (previously [facility]).  Patient will discharge to [facility] Anticipated discharge date: 11/27/17 Family notified: Elvina Mattes, fiance Transportation by: Corey Harold  Nurse to call report to [phone].    CSW signing off.  Estanislado Emms, [place]  Clinical Social Worker

## 2017-11-27 NOTE — Discharge Summary (Signed)
Physician Discharge Summary  Ian Bond GEX:[id number] DOB: 28-Jun-1976 DOA: 11/16/2017  PCP: Patient, No Pcp Per  Admit date: 11/16/2017 Discharge date: 11/27/2017  Admitted From: Home Disposition: SNF  Recommendations for Outpatient Follow-up:  1. Continued diabetes education 2. Recommend follow-up with registered dietitian 3. Continue good bowel regimen while on narcotic therapy 4. Outpatient follow-up with Dr. Sharol Given in 1 week 5. Please obtain BMP/CBC in one week for leukocytosis resolution and to recheck potassium 6. Please follow up on the following pending results: None  Home Health: SNF Equipment/Devices: Wheelchair, 3 in 1, Rolling walker with 5" wheels  Discharge Condition: Stable CODE STATUS: Full code Diet recommendation: Heart healthy/carb modified   Brief/Interim Summary:  Admission HPI written by Vernelle Emerald, MD   CHIEF COMPLAINT:  Severe diabetic ketoacidosis and bilateral diabetic foot infections  HISTORY OF PRESENT ILLNESS:   Ian Bond is a 42 y/o man who does not follow regularly with physicians for 20 years and takes no medications at home who came to the ED for worsening shortness of breath today and found to be in severe DKA. He states feeling in his usual health until Easter Sunday when he started to have nausea and vomiting and then some shortness of breath. He attributed this to seasonal allergies and took OTC antihistamines. This became more severe today so he called out from work and came to the ED for evaluation. He was found to be severely acidotic with [place] respirations for compensation with a glucose >700 and also to have bilateral foot infections. He was started on broad spectrum antibiotics, insulin infusion, and IV bicarbonate. PCCM was consulted for admission due to severe acidosis at risk for respiratory decompensation.    Hospital course:  DKA The setting of newly diagnosed diabetes mellitus.  Hemoglobin A1c of  12.2%.  Patient treated with IV insulin and was transitioned to subcutaneous insulin.  Resolved  Diabetes mellitus, type II, uncontrolled with hyperglycemia As mentioned above, patient presented in DKA.  Patient is transition to subcutaneous Lantus and NovoLog.  Fasting blood sugar adequately controlled.  It is Lantus 40 units nightly in addition to NovoLog 6 units 3 times daily with meals.  Patient currently without insurance and may need adjustment to insulin 70/30 if unable to obtain insurance during rehab.  Recommend case manager consult to help with medication needs.  Gangrene of right and left foot Diabetic infection of bilateral feet Patient initially treated with empiric vancomycin and Zosyn.  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  Patient is currently weightbearing on heels for transfers only.  No gait training. Outpatient follow-up with surgery.   Patient discharged with narcotic prescription.  Please continue bowel regimen while on narcotic therapy.  Taper off patient is having diarrhea   Bacterial gastroenteritis GI pathogen panel significant for EAEC.  Patient was started on ciprofloxacin for treatment.  Symptoms mild and ciprofloxacin was discontinued after 3 days.  Symptoms improving on discharge with formation of soft stools.  Acute blood loss anemia Secondary to surgery. Stable prior to discharge. Recheck CBC.  Leukocytosis Likely reactive secondary to acute GI infection.  Foot wounds appear to uninfected.  No associated fevers. Improving prior to discharge.  Thrombocytosis Also likely reactive. Still rising. Watch intermittently.  Acute kidney injury Resolved  Ileus Treated with mobilization and diet modification.  Patient improved on clears, transitioned to oral liquids and then eventually transition to a regular diet.  Ileus on x-ray resolved.  Clinically resolved.  Ethanol abuse  No withdrawal  symptoms.  Obesity Body mass index is 34.11 kg/m.   Discharge Diagnoses:  Principal Problem:   DKA (diabetic ketoacidoses) ([place]) Active Problems:   Diabetic infection of left foot ([place])   Diabetic infection of right foot ([place])   Gangrene of left foot ([place])   Gangrene of right foot [facility])    Discharge Instructions  Discharge Instructions    Call MD for:  redness, tenderness, or signs of infection (pain, swelling, redness, odor or green/yellow discharge around incision site)   Complete by:  As directed    Call MD for:  severe uncontrolled pain   Complete by:  As directed    Increase activity slowly   Complete by:  As directed      Allergies as of 11/27/2017   No Known Allergies     Medication List    TAKE these medications   bismuth subsalicylate 132 GM/01UU suspension Commonly known as:  PEPTO BISMOL Take 30 mLs by mouth as needed for indigestion or diarrhea or loose stools.   diphenhydrAMINE 25 mg capsule Commonly known as:  BENADRYL Take 25 mg by mouth daily.   feeding supplement (GLUCERNA SHAKE) Liqd Take 237 mLs by mouth 2 (two) times daily between meals.   gabapentin 300 MG capsule Commonly known as:  NEURONTIN Take 1 capsule (300 mg total) by mouth 3 (three) times daily.   insulin aspart 100 UNIT/ML injection Commonly known as:  novoLOG Inject 6 Units into the skin 3 (three) times daily with meals.   insulin glargine 100 UNIT/ML injection Commonly known as:  LANTUS Inject 0.4 mLs (40 Units total) into the skin at bedtime.   metoprolol tartrate 25 MG tablet Commonly known as:  LOPRESSOR Take 0.5 tablets (12.5 mg total) by mouth 2 (two) times daily.   oxyCODONE 5 MG immediate release tablet Commonly known as:  Oxy IR/ROXICODONE Take 1-2 tablets (5-10 mg total) by mouth every 4 (four) hours as needed for moderate pain.   polyethylene glycol packet Commonly known as:  MIRALAX / GLYCOLAX Take 17 g by mouth daily. Start taking on:  11/28/2017   senna  8.6 MG Tabs tablet Commonly known as:  SENOKOT Take 1 tablet (8.6 mg total) by mouth daily.       Contact information for follow-up providers    Newt Minion, MD In 1 week.   Specialty:  Orthopedic Surgery Contact information: [place] [address] [place]. Go on 12/13/2017.   Why:  at 9:50 am for a hospital follow up appointment with Dr Margarita Rana.  please bring all of  your medications with you to the appointment.  Contact information: [place] [id number] [place] .   Contact information: [place] [phone]           Contact information for after-discharge care    Destination    [facility] .   Service:  Skilled Nursing Contact information: [address] [place] [phone]                 No Known Allergies  Consultations:  Orthopedic surgery  Critical care medicine   Procedures/Studies: Mr Foot Right W Wo Contrast  Result Date: 11/17/2017 CLINICAL DATA:  Diabetic foot ulcers with right second toe infection. EXAM:  MRI OF THE RIGHT FOREFOOT WITH CONTRAST TECHNIQUE: Multiplanar, multisequence MR imaging of the right forefoot was performed following the administration of intravenous contrast. CONTRAST:  12mL MULTIHANCE GADOBENATE DIMEGLUMINE 529 MG/ML IV SOLN COMPARISON:  Right foot x-rays from yesterday. FINDINGS: Bones/Joint/Cartilage Diffusely decreased T1 marrow signal without edema or enhancement of the second middle and distal phalanges, consistent with osteonecrosis. Osseous destruction of the second distal phalanx. Mild marrow edema within the second proximal phalanx with preserved T1 marrow signal. No fracture or dislocation. Normal alignment. Trace second and third MTP joint effusions.  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 Ligaments Collateral ligaments are intact. Muscles and Tendons Flexor, peroneal and extensor compartment tendons are intact. Increased T2 signal within the intrinsic muscles of the forefoot, nonspecific, but likely related to diabetic muscle changes. Soft tissue Diffuse skin thickening and soft tissue swelling about the second toe with subcutaneous emphysema, which extends dorsally to the mid second metatarsal. No fluid collection. IMPRESSION: 1. 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 Subcutaneous emphysema extends dorsally to the level of the mid second metatarsal. Electronically Signed   By: Titus Dubin M.D.   On: 11/17/2017 12:14   Mr Foot Left W Wo Contrast  Result Date: 11/17/2017 CLINICAL DATA:  Nonhealing diabetic ulcers with left great toe infection. EXAM: MRI OF THE LEFT FOREFOOT WITH CONTRAST TECHNIQUE: Multiplanar, multisequence MR imaging of the left forefoot was performed following the administration of intravenous contrast. CONTRAST:  24mL MULTIHANCE GADOBENATE DIMEGLUMINE 529 MG/ML IV SOLN COMPARISON:  Left foot x-rays from yesterday. FINDINGS: Bones/Joint/Cartilage Diffusely decreased marrow signal within the first proximal and distal phalanges without edema or enhancement, consistent with osteonecrosis. Marrow edema within the second distal phalanx with preserved T1 marrow signal. Moderate osteoarthritis of the first MTP joint. Moderate first MTP joint effusion. No acute fracture or dislocation. Muscles and Tendons Increased T2 signal within the intrinsic muscles of the forefoot, nonspecific, but likely related to diabetic muscle changes. No evidence of tenosynovitis. Soft tissues Diffuse skin thickening and soft tissue edema about the dorsal forefoot. Prominent inflammatory changes with subcutaneous emphysema about the great toe with nonenhancement of the soft tissue, consistent  with gangrene. Subcutaneous emphysema extends dorsally to the mid first metatarsal. Small ulceration along the tip of the second toe. Blistering of the dorsal skin along the distal forefoot. IMPRESSION: 1. Gangrenous appearance of the great toe with osteonecrosis of the first proximal and distal phalanges. Subcutaneous emphysema extends dorsally to the mid first metatarsal. 2. Moderate first MTP joint effusion. Septic arthritis is not excluded. 3. Small ulceration at the tip of the second toe with underlying marrow edema with normal T1 signal in the second distal phalanx, likely reactive. Electronically Signed   By: Titus Dubin M.D.   On: 11/17/2017 12:04   Dg Chest Port 1 View  Result Date: 11/16/2017 CLINICAL DATA:  Weakness and generalized pain EXAM: PORTABLE CHEST 1 VIEW COMPARISON:  None. FINDINGS: Lungs are clear. Heart size and pulmonary vascularity are normal. No adenopathy. No bone lesions. IMPRESSION: No edema or consolidation. Electronically Signed   By: Lowella Grip III M.D.   On: 11/16/2017 11:22   Dg Abd Portable 1v  Result Date: 11/27/2017 CLINICAL DATA:  History of ileus, follow-up EXAM: PORTABLE ABDOMEN - 1 VIEW COMPARISON:  Abdomen films of 11/26/2017 FINDINGS: Both large and small bowel gas is present currently with no distention. No opaque calculi are noted. The bones appear normal. IMPRESSION: Large and small  bowel gas is present without distension. Electronically Signed   By: Ivar Drape M.D.   On: 11/27/2017 10:42   Dg Abd Portable 1v  Result Date: 11/26/2017 CLINICAL DATA:  Ileus. EXAM: PORTABLE ABDOMEN - 1 VIEW COMPARISON:  11/24/2017 FINDINGS: Improving ileus involving predominantly the colon with the mid transverse colon now measuring approximately 7.5 cm compared to 9.5 cm on the prior study. No significant small bowel dilatation. No gross signs of free air or pneumatosis. IMPRESSION: Improving colonic ileus. Electronically Signed   By: Aletta Edouard M.D.   On:  11/26/2017 09:18   Dg Abd Portable 1v  Result Date: 11/24/2017 CLINICAL DATA:  Abdominal distention. EXAM: PORTABLE ABDOMEN - 1 VIEW COMPARISON:  11/23/2017. FINDINGS: Moderate distention of small and large bowel noted suggesting adynamic ileus. Follow-up exam suggested demonstrate resolution and to exclude bowel obstruction. No free air. Degenerative changes lumbar spine with scoliosis concave left IMPRESSION: Marks tension is small and large bowel noted suggesting adynamic ileus. Follow-up exam suggested to demonstrate resolution and to exclude bowel obstruction. Electronically Signed   By: Marcello Moores  Register   On: 11/24/2017 11:04   Dg Abd Portable 1v  Result Date: 11/23/2017 CLINICAL DATA:  Abdominal distension EXAM: PORTABLE ABDOMEN - 1 VIEW COMPARISON:  None. FINDINGS: There is colonic dilatation. No appreciable small bowel dilatation. No free air evident. No abnormal calcifications. IMPRESSION: Colonic dilatation. Question colonic ileus versus a degree of distal obstruction. No free air evident. No appreciable small bowel dilatation. Electronically Signed   By: Lowella Grip III M.D.   On: 11/23/2017 14:14   Dg Foot 2 Views Left  Result Date: 11/16/2017 CLINICAL DATA:  Ulcers EXAM: LEFT FOOT - 2 VIEW COMPARISON:  None. FINDINGS: Moderate to advanced degenerative changes at the 1st MTP joint and IP joint. Soft tissue gas noted within the great toe soft tissues and extending along the dorsum of the foot. No definite bony changes of osteomyelitis. No fracture, subluxation or dislocation. IMPRESSION: Extensive soft tissue gas in the left great toe and along the dorsum of the foot. No definite changes of osteomyelitis noted. If there is high clinical suspicion for osteomyelitis, MRI would be more sensitive. Electronically Signed   By: Rolm Baptise M.D.   On: 11/16/2017 11:23   Dg Foot 2 Views Right  Result Date: 11/16/2017 CLINICAL DATA:  Diabetic foot ulcers. EXAM: RIGHT FOOT - 2 VIEW COMPARISON:   None. FINDINGS: No acute fracture or dislocation. Osteolysis of the second distal phalanx. Prominent soft tissue swelling of the second toe with dorsal subcutaneous emphysema extending to the distal metatarsal. Mild osteoarthritis of the first MTP joint. IMPRESSION: 1. Soft tissue swelling and subcutaneous emphysema in the second toe with the extension to the distal metatarsal, consistent with necrotizing infection. Osteomyelitis of the second distal phalanx. Electronically Signed   By: Titus Dubin M.D.   On: 11/16/2017 11:26     Subjective: No issues. No abdominal pain. Foot pain is tolerable.  Discharge Exam: Vitals:   11/27/17 0550 11/27/17 0917  BP: (!) 124/98 125/75  Pulse: 85   Resp: (!) 21   Temp: 98.5 F (36.9 C)   SpO2: 100%    Vitals:   11/26/17 1502 11/26/17 2131 11/27/17 0550 11/27/17 0917  BP: (!) 143/91 137/86 (!) 124/98 125/75  Pulse: 94 94 85   Resp: (!) 23 20 (!) 21   Temp: 98.9 F (37.2 C) 98.5 F (36.9 C) 98.5 F (36.9 C)   TempSrc: Oral Oral Oral   SpO2:  99% 100% 100%   Weight:   124.3 kg (274 lb 0.5 oz)   Height:        General: Pt is alert, awake, not in acute distress Cardiovascular: RRR, S1/S2 +, no rubs, no gallops Respiratory: CTA bilaterally, no wheezing, no rhonchi Abdominal: Soft, NT, ND, bowel sounds + Extremities: no edema, no cyanosis. Bilateral feet with dressing    The results of significant diagnostics from this hospitalization (including imaging, microbiology, ancillary and laboratory) are listed below for reference.     Microbiology: Recent Results (from the past 240 hour(s))  Surgical pcr screen     Status: None   Collection Time: 11/18/17 12:00 AM  Result Value Ref Range Status   MRSA, PCR NEGATIVE NEGATIVE Final   Staphylococcus aureus NEGATIVE NEGATIVE Final    Comment: (NOTE) The Xpert SA Assay (FDA approved for NASAL specimens in patients 54 years of age and older), is one component of a comprehensive surveillance  program. It is not intended to diagnose infection nor to guide or monitor treatment. Performed at [facility], [place] [address] [place], [place] [address]   C difficile quick scan w PCR reflex     Status: None   Collection Time: 11/20/17 11:40 AM  Result Value Ref Range Status   C Diff antigen NEGATIVE NEGATIVE Final   C Diff toxin NEGATIVE NEGATIVE Final   C Diff interpretation No C. difficile detected.  Final  Gastrointestinal Panel by PCR , Stool     Status: Abnormal   Collection Time: 11/20/17 11:40 AM  Result Value Ref Range Status   Campylobacter species NOT DETECTED NOT DETECTED Final   Plesimonas shigelloides NOT DETECTED NOT DETECTED Final   Salmonella species NOT DETECTED NOT DETECTED Final   Yersinia enterocolitica NOT DETECTED NOT DETECTED Final   Vibrio species NOT DETECTED NOT DETECTED Final   Vibrio cholerae NOT DETECTED NOT DETECTED Final   Enteroaggregative E coli (EAEC) DETECTED (A) NOT DETECTED Final    Comment: RESULT CALLED TO, READ BACK BY AND VERIFIED WITH: MELYNN MUELLER AT 1850 11/20/2017.  TFK    Enteropathogenic E coli (EPEC) NOT DETECTED NOT DETECTED Final   Enterotoxigenic E coli (ETEC) NOT DETECTED NOT DETECTED Final   Shiga like toxin producing E coli (STEC) NOT DETECTED NOT DETECTED Final   Shigella/Enteroinvasive E coli (EIEC) NOT DETECTED NOT DETECTED Final   Cryptosporidium NOT DETECTED NOT DETECTED Final   Cyclospora cayetanensis NOT DETECTED NOT DETECTED Final   Entamoeba histolytica NOT DETECTED NOT DETECTED Final   Giardia lamblia NOT DETECTED NOT DETECTED Final   Adenovirus F40/41 NOT DETECTED NOT DETECTED Final   Astrovirus NOT DETECTED NOT DETECTED Final   Norovirus GI/GII NOT DETECTED NOT DETECTED Final   Rotavirus A NOT DETECTED NOT DETECTED Final   Sapovirus (I, II, IV, and V) NOT DETECTED NOT DETECTED Final    Comment: Performed at [facility], [place] [place], [place] [address]     Labs: BNP (last 3  results) No results for input(s): BNP in the last 8760 hours. Basic Metabolic Panel: Recent Labs  Lab 11/21/17 0942 11/22/17 0339 11/23/17 0424 11/24/17 0610 11/25/17 1034 11/26/17 0322  NA 132* 134* 136  --   --  136  K 3.1* 3.1* 3.1* 3.1* 3.4* 3.2*  CL 100* 99* 106  --   --  102  CO2 24 24 22   --   --  25  GLUCOSE 296* 180* 178*  --   --  187*  BUN 5* <5* <5*  --   --  <5*  CREATININE 0.86 0.75 0.81  --   --  0.72  CALCIUM 8.6* 8.7* 8.3*  --   --  8.5*   Liver Function Tests: No results for input(s): AST, ALT, ALKPHOS, BILITOT, PROT, ALBUMIN in the last 168 hours. No results for input(s): LIPASE, AMYLASE in the last 168 hours. No results for input(s): AMMONIA in the last 168 hours. CBC: Recent Labs  Lab 11/21/17 0942 11/22/17 0339 11/23/17 0424 11/24/17 0610 11/26/17 0322  WBC 25.2* 27.2* 26.4* 23.3* 17.8*  NEUTROABS  --  20.9*  --   --   --   HGB 9.6* 9.3* 8.5* 8.5* 8.3*  HCT 28.3* 27.2* 25.2* 25.4* 24.8*  MCV 84.7 84.2 84.6 84.9 84.6  PLT 504* 523* 551* 599* 710*   Cardiac Enzymes: No results for input(s): CKTOTAL, CKMB, CKMBINDEX, TROPONINI in the last 168 hours. BNP: Invalid input(s): POCBNP CBG: Recent Labs  Lab 11/26/17 1146 11/26/17 1548 11/26/17 2128 11/27/17 0738 11/27/17 1107  GLUCAP 151* 172* 121* 98 117*   D-Dimer No results for input(s): DDIMER in the last 72 hours. Hgb A1c No results for input(s): HGBA1C in the last 72 hours. Lipid Profile No results for input(s): CHOL, HDL, LDLCALC, TRIG, CHOLHDL, LDLDIRECT in the last 72 hours. Thyroid function studies No results for input(s): TSH, T4TOTAL, T3FREE, THYROIDAB in the last 72 hours.  Invalid input(s): FREET3 Anemia work up No results for input(s): VITAMINB12, FOLATE, FERRITIN, TIBC, IRON, RETICCTPCT in the last 72 hours. Urinalysis    Component Value Date/Time   COLORURINE YELLOW 11/16/2017 0813   APPEARANCEUR HAZY (A) 11/16/2017 0813   LABSPEC 1.014 11/16/2017 0813   PHURINE 5.0  11/16/2017 0813   GLUCOSEU >=500 (A) 11/16/2017 0813   HGBUR MODERATE (A) 11/16/2017 0813   BILIRUBINUR NEGATIVE 11/16/2017 0813   KETONESUR 80 (A) 11/16/2017 0813   PROTEINUR 30 (A) 11/16/2017 0813   NITRITE NEGATIVE 11/16/2017 0813   LEUKOCYTESUR NEGATIVE 11/16/2017 0813   Sepsis Labs Invalid input(s): PROCALCITONIN,  WBC,  LACTICIDVEN Microbiology Recent Results (from the past 240 hour(s))  Surgical pcr screen     Status: None   Collection Time: 11/18/17 12:00 AM  Result Value Ref Range Status   MRSA, PCR NEGATIVE NEGATIVE Final   Staphylococcus aureus NEGATIVE NEGATIVE Final    Comment: (NOTE) The Xpert SA Assay (FDA approved for NASAL specimens in patients 19 years of age and older), is one component of a comprehensive surveillance program. It is not intended to diagnose infection nor to guide or monitor treatment. Performed at [facility], [place] [address], [place], [place] [address]   C difficile quick scan w PCR reflex     Status: None   Collection Time: 11/20/17 11:40 AM  Result Value Ref Range Status   C Diff antigen NEGATIVE NEGATIVE Final   C Diff toxin NEGATIVE NEGATIVE Final   C Diff interpretation No C. difficile detected.  Final  Gastrointestinal Panel by PCR , Stool     Status: Abnormal   Collection Time: 11/20/17 11:40 AM  Result Value Ref Range Status   Campylobacter species NOT DETECTED NOT DETECTED Final   Plesimonas shigelloides NOT DETECTED NOT DETECTED Final   Salmonella species NOT DETECTED NOT DETECTED Final   Yersinia enterocolitica NOT DETECTED NOT DETECTED Final   Vibrio species NOT DETECTED NOT DETECTED Final   Vibrio cholerae NOT DETECTED NOT DETECTED Final   Enteroaggregative E coli (EAEC) DETECTED (A) NOT DETECTED  Final    Comment: RESULT CALLED TO, READ BACK BY AND VERIFIED WITH: [facility] MUELLER AT 1850 11/20/2017.  TFK    Enteropathogenic E coli (EPEC) NOT DETECTED NOT DETECTED Final   Enterotoxigenic E coli (ETEC) NOT DETECTED NOT  DETECTED Final   Shiga like toxin producing E coli (STEC) NOT DETECTED NOT DETECTED Final   Shigella/Enteroinvasive E coli (EIEC) NOT DETECTED NOT DETECTED Final   Cryptosporidium NOT DETECTED NOT DETECTED Final   Cyclospora cayetanensis NOT DETECTED NOT DETECTED Final   Entamoeba histolytica NOT DETECTED NOT DETECTED Final   Giardia lamblia NOT DETECTED NOT DETECTED Final   Adenovirus F40/41 NOT DETECTED NOT DETECTED Final   Astrovirus NOT DETECTED NOT DETECTED Final   Norovirus GI/GII NOT DETECTED NOT DETECTED Final   Rotavirus A NOT DETECTED NOT DETECTED Final   Sapovirus (I, II, IV, and V) NOT DETECTED NOT DETECTED Final    Comment: Performed at [facility], [address], [place], [place] [address]     SIGNED:   Cordelia Poche, MD [facility] 11/27/2017, 12:28 PM

## 2017-11-27 NOTE — Discharge Instructions (Signed)
Ian Bond,  You are admitted to the hospital because of diabetic acidosis in addition to evidence of gangrene of your feet.  You are treated for the diabetic ketoacidosis with insulin.  You can start on an insulin regimen that appears to have good control of your diabetes and high blood sugar.  With regard to your toes, you had an amputation for both feet.  He will follow-up with your orthopedic surgeon as an outpatient.  On discharge you will go to a skilled nursing facility for rehab purposes.

## 2017-12-05 ENCOUNTER — Encounter (INDEPENDENT_AMBULATORY_CARE_PROVIDER_SITE_OTHER): Payer: Self-pay | Admitting: Orthopedic Surgery

## 2017-12-05 ENCOUNTER — Ambulatory Visit (INDEPENDENT_AMBULATORY_CARE_PROVIDER_SITE_OTHER): Payer: Self-pay | Admitting: Orthopedic Surgery

## 2017-12-05 VITALS — Ht 75.0 in | Wt 274.0 lb

## 2017-12-05 DIAGNOSIS — Z89431 Acquired absence of right foot: Secondary | ICD-10-CM

## 2017-12-05 DIAGNOSIS — Z89432 Acquired absence of left foot: Secondary | ICD-10-CM

## 2017-12-05 NOTE — Progress Notes (Signed)
Office Visit Note   Patient: Ian Bond           Date of Birth: 1976/02/09           MRN: [id number] Visit Date: 12/05/2017              Requested by: No referring provider defined for this encounter. PCP: Patient, No Pcp Per  Chief Complaint  Patient presents with  . Left Foot - Routine Post Op    11/18/17 bilat transmet amputation   . Right Foot - Routine Post Op      HPI: Patient is a 42 year old gentleman who presents status post bilateral transmetatarsal amputations.  Patient presents with massive swelling in both lower extremities.  Assessment & Plan: Visit Diagnoses: No diagnosis found.  Plan: We will wrap both legs with a Dynaflex wrap follow-up in 1 week to change the wrap.  Discussed the importance of elevation and nonweightbearing he may be weightbearing on the heels for transfers only.  Patient needs nutrition protein supplements for the protein caloric malnutrition.  Follow-Up Instructions: No follow-ups on file.   Ortho Exam  Patient is alert, oriented, no adenopathy, well-dressed, normal affect, normal respiratory effort. Examination patient has significant swelling in both lower extremities there is slight wound dehiscence secondary to the swelling there is a small amount of black eschar on the left transmetatarsal imitation approximately 3 mm in width the entire length of the incision in the right lower extremity has gaped open approximately 3 mm with granulation tissue at the base.  There is no cellulitis no odor no drainage.  Imaging: No results found. No images are attached to the encounter.  Labs: Lab Results  Component Value Date   HGBA1C 12.2 (H) 11/17/2017   REPTSTATUS 11/21/2017 FINAL 11/16/2017   CULT  11/16/2017    NO GROWTH 5 DAYS Performed at [facility], [place] [address], [place], [place] [address]      Lab Results  Component Value Date   ALBUMIN 1.7 (L) 11/19/2017   ALBUMIN 2.0 (L) 11/18/2017   ALBUMIN 2.6 (L)  11/16/2017    Body mass index is 34.25 kg/m.  Orders:  No orders of the defined types were placed in this encounter.  No orders of the defined types were placed in this encounter.    Procedures: No procedures performed  Clinical Data: No additional findings.  ROS:  All other systems negative, except as noted in the HPI. Review of Systems  Objective: Vital Signs: Ht 6\' 3"  (1.905 m)   Wt 274 lb (124.3 kg)   BMI 34.25 kg/m   Specialty Comments:  No specialty comments available.  PMFS History: Patient Active Problem List   Diagnosis Date Noted  . Gangrene of left foot ([place])   . Gangrene of right foot ([place])   . DKA (diabetic ketoacidoses) ([employer]) 11/16/2017  . Diabetic infection of left foot ([place]) 11/16/2017  . Diabetic infection of right foot ([place]) 11/16/2017   Past Medical History:  Diagnosis Date  . Diabetes mellitus without complication ([place])     Family History  Problem Relation Age of Onset  . Diabetes Maternal Uncle     Past Surgical History:  Procedure Laterality Date  . AMPUTATION TOE Bilateral 11/18/2017   Procedure: BILATERAL TRANSMETETARSAL AMPUTATION;  Surgeon: Newt Minion, MD;  Location: [facility];  Service: Orthopedics;  Laterality: Bilateral;   Social History   Occupational History  . Not on file  Tobacco Use  . Smoking status: Never Smoker  .  Smokeless tobacco: Never Used  Substance and Sexual Activity  . Alcohol use: Not Currently  . Drug use: Never  . Sexual activity: Yes

## 2017-12-13 ENCOUNTER — Ambulatory Visit (INDEPENDENT_AMBULATORY_CARE_PROVIDER_SITE_OTHER): Payer: Self-pay | Admitting: Family

## 2017-12-13 ENCOUNTER — Encounter (INDEPENDENT_AMBULATORY_CARE_PROVIDER_SITE_OTHER): Payer: Self-pay | Admitting: Family

## 2017-12-13 ENCOUNTER — Ambulatory Visit: Payer: Self-pay | Admitting: Family Medicine

## 2017-12-13 DIAGNOSIS — I96 Gangrene, not elsewhere classified: Secondary | ICD-10-CM

## 2017-12-13 NOTE — Progress Notes (Signed)
Office Visit Note   Patient: Ian Bond           Date of Birth: 1975/10/21           MRN: [id number] Visit Date: 12/13/2017              Requested by: No referring provider defined for this encounter. PCP: Patient, No Pcp Per  Chief Complaint  Patient presents with  . Left Foot - Follow-up, Routine Post Op  . Right Foot - Follow-up, Routine Post Op      HPI: Patient is a 42 year old gentleman who presents status post bilateral transmetatarsal amputations.  Patient presents with massive swelling in both lower extremities.  Assessment & Plan: Visit Diagnoses:  1. Gangrene of left foot ([place])   2. Gangrene of right foot ([place])     Plan: We will follow up in office in 1 week to complete suture removal. Discussed the importance of elevation and nonweightbearing. he may be weightbearing on the heels for transfers only.  Follow-Up Instructions: Return in about 1 week (around 12/20/2017).   Ortho Exam  Patient is alert, oriented, no adenopathy, well-dressed, normal affect, normal respiratory effort. Examination patient has significant swelling in both lower extremities. There is a small amount of black eschar on the left transmetatarsal imitation approximately 3 mm in width to the lateral half. the entire length of the right transmetatarsal amputation has approximately 3 mm x 3 cm of fibrinous tissue. No depth. There is no cellulitis no odor no drainage.  Imaging: No results found. No images are attached to the encounter.  Labs: Lab Results  Component Value Date   HGBA1C 12.2 (H) 11/17/2017   REPTSTATUS 11/21/2017 FINAL 11/16/2017   CULT  11/16/2017    NO GROWTH 5 DAYS Performed at [facility], [place] [address] [place], [place] [address]      Lab Results  Component Value Date   ALBUMIN 1.7 (L) 11/19/2017   ALBUMIN 2.0 (L) 11/18/2017   ALBUMIN 2.6 (L) 11/16/2017    There is no height or weight on file to calculate BMI.  Orders:  No orders of the  defined types were placed in this encounter.  No orders of the defined types were placed in this encounter.    Procedures: No procedures performed  Clinical Data: No additional findings.  ROS:  All other systems negative, except as noted in the HPI. Review of Systems  Constitutional: Negative for chills and fever.  Cardiovascular: Positive for leg swelling.  Skin: Negative for color change.    Objective: Vital Signs: There were no vitals taken for this visit.  Specialty Comments:  No specialty comments available.  PMFS History: Patient Active Problem List   Diagnosis Date Noted  . Gangrene of left foot ([place])   . Gangrene of right foot ([place])   . DKA (diabetic ketoacidoses) ([place]) 11/16/2017  . Diabetic infection of left foot ([place]) 11/16/2017  . Diabetic infection of right foot ([place]) 11/16/2017   Past Medical History:  Diagnosis Date  . Diabetes mellitus without complication ([place])     Family History  Problem Relation Age of Onset  . Diabetes Maternal Uncle     Past Surgical History:  Procedure Laterality Date  . AMPUTATION TOE Bilateral 11/18/2017   Procedure: BILATERAL TRANSMETETARSAL AMPUTATION;  Surgeon: Newt Minion, MD;  Location: [facility];  Service: Orthopedics;  Laterality: Bilateral;   Social History   Occupational History  . Not on file  Tobacco Use  . Smoking  status: Never Smoker  . Smokeless tobacco: Never Used  Substance and Sexual Activity  . Alcohol use: Not Currently  . Drug use: Never  . Sexual activity: Yes

## 2017-12-20 ENCOUNTER — Ambulatory Visit (INDEPENDENT_AMBULATORY_CARE_PROVIDER_SITE_OTHER): Payer: Self-pay | Admitting: Family

## 2017-12-20 ENCOUNTER — Encounter (INDEPENDENT_AMBULATORY_CARE_PROVIDER_SITE_OTHER): Payer: Self-pay | Admitting: Family

## 2017-12-20 DIAGNOSIS — Z89432 Acquired absence of left foot: Secondary | ICD-10-CM

## 2017-12-20 DIAGNOSIS — Z89431 Acquired absence of right foot: Secondary | ICD-10-CM

## 2017-12-20 MED ORDER — SILVER SULFADIAZINE 1 % EX CREA: 1.0000 "application " | TOPICAL_CREAM | Freq: Every day | CUTANEOUS | 0 refills | Status: DC

## 2017-12-20 MED ORDER — SILVER SULFADIAZINE 1 % EX CREA: 1.0000 "application " | TOPICAL_CREAM | Freq: Every day | CUTANEOUS | 0 refills | Status: AC

## 2017-12-20 MED ORDER — FLUTICASONE PROPIONATE 50 MCG/ACT NA SUSP: 1.0000 | Freq: Every day | NASAL | 0 refills | Status: AC

## 2017-12-20 NOTE — Progress Notes (Signed)
Office Visit Note   Patient: Mac Dowdell           Date of Birth: [birth year]           MRN: [id number] Visit Date: 12/20/2017              Requested by: No referring provider defined for this encounter. PCP: Patient, No Pcp Per  Chief Complaint  Patient presents with  . Right Foot - Routine Post Op  . Left Foot - Routine Post Op      HPI: Patient is a 42 year old gentleman who presents status post bilateral transmetatarsal amputations.  Patient presents with slight improvement to swelling in both lower extremities. Is nonweight bearing in wheelchair.  Assessment & Plan: Visit Diagnoses:  1. History of transmetatarsal amputation of left foot ([place])   2. History of transmetatarsal amputation of right foot ([place])     Plan: Sutures harvested today. Will begin touch down weight bearing on left. May advance weight bearing on right. Silvadene dressings to left transmet. Will discuss spacers at future visit.   Follow-Up Instructions: Return in about 2 weeks (around 01/03/2018).   Ortho Exam  Patient is alert, oriented, no adenopathy, well-dressed, normal affect, normal respiratory effort. Examination patient has significant swelling in both lower extremities. There is a small amount of fibrinous tissue on the left transmetatarsal amputation approximately 5 cm x 1 cm to the lateral half. Right TMA is healing well. No drainage no gaping. There is no cellulitis no odor no drainage.  Imaging: No results found. No images are attached to the encounter.  Labs: Lab Results  Component Value Date   HGBA1C 12.2 (H) 11/17/2017   REPTSTATUS 11/21/2017 FINAL 11/16/2017   CULT  11/16/2017    NO GROWTH 5 DAYS Performed at [facility], [place] [address], [place], [place] [address]      Lab Results  Component Value Date   ALBUMIN 1.7 (L) 11/19/2017   ALBUMIN 2.0 (L) 11/18/2017   ALBUMIN 2.6 (L) 11/16/2017    There is no height or weight on file to calculate BMI.  Orders:    No orders of the defined types were placed in this encounter.  No orders of the defined types were placed in this encounter.    Procedures: No procedures performed  Clinical Data: No additional findings.  ROS:  All other systems negative, except as noted in the HPI. Review of Systems  Constitutional: Negative for chills and fever.  Cardiovascular: Positive for leg swelling.  Skin: Negative for color change.    Objective: Vital Signs: There were no vitals taken for this visit.  Specialty Comments:  No specialty comments available.  PMFS History: Patient Active Problem List   Diagnosis Date Noted  . Gangrene of left foot ([place])   . Gangrene of right foot ([place])   . DKA (diabetic ketoacidoses) ([place]) 11/16/2017  . Diabetic infection of left foot ([place]) 11/16/2017  . Diabetic infection of right foot ([place]) 11/16/2017   Past Medical History:  Diagnosis Date  . Diabetes mellitus without complication ([place])     Family History  Problem Relation Age of Onset  . Diabetes Maternal Uncle     Past Surgical History:  Procedure Laterality Date  . AMPUTATION TOE Bilateral 11/18/2017   Procedure: BILATERAL TRANSMETETARSAL AMPUTATION;  Surgeon: Newt Minion, MD;  Location: [facility];  Service: Orthopedics;  Laterality: Bilateral;   Social History   Occupational History  . Not on file  Tobacco Use  .  Smoking status: Never Smoker  . Smokeless tobacco: Never Used  Substance and Sexual Activity  . Alcohol use: Not Currently  . Drug use: Never  . Sexual activity: Yes

## 2017-12-21 ENCOUNTER — Telehealth (INDEPENDENT_AMBULATORY_CARE_PROVIDER_SITE_OTHER): Payer: Self-pay | Admitting: Orthopedic Surgery

## 2017-12-21 NOTE — Telephone Encounter (Signed)
Chris from [employer] called wanting to talk with you about a concern he had for patients right foot. If he puts too much pressure on his right foot it may cause the wound to reopen and cause more damage. He would like to speak with you about this. Facility # [phone]  Personal # [phone]

## 2017-12-21 NOTE — Telephone Encounter (Signed)
Called and lm on vm to advise that the pt was in the office yesterday and Dr. Sharol Given advised that he could advance as tolerated on the right.

## 2018-01-03 ENCOUNTER — Ambulatory Visit (INDEPENDENT_AMBULATORY_CARE_PROVIDER_SITE_OTHER): Payer: Self-pay | Admitting: Family

## 2018-01-03 DIAGNOSIS — Z89432 Acquired absence of left foot: Secondary | ICD-10-CM

## 2018-01-03 DIAGNOSIS — Z89431 Acquired absence of right foot: Secondary | ICD-10-CM

## 2018-01-03 NOTE — Progress Notes (Signed)
Office Visit Note   Patient: Ian Bond           Date of Birth: 04/02/1976           MRN: [id number] Visit Date: 01/03/2018              Requested by: No referring provider defined for this encounter. PCP: Patient, No Pcp Per  Chief Complaint  Patient presents with  . Right Foot - Routine Post Op    S/p transmet 11/18/17  . Left Foot - Routine Post Op    S/p transmet amp 11/18/17      HPI: Patient is a 42 year old gentleman who presents status post bilateral transmetatarsal amputations.  Is nonweight bearing in wheelchair.  Assessment & Plan: Visit Diagnoses:  No diagnosis found.  Plan:  May advance weight bearing in regular shoewear. Continue daily wound cleansing. Silvadene dressings to left transmet. Given order for spacers to [employer].   Follow-Up Instructions: No follow-ups on file.   Ortho Exam  Patient is alert, oriented, no adenopathy, well-dressed, normal affect, normal respiratory effort. Examination patient has significant swelling in both lower extremities. There is a small amount of fibrinous tissue on the left transmetatarsal amputation approximately 5 cm x cm to the lateral half. There is no drainage, erythema, odor or sign of infection. Right TMA is healing well. No drainage no gaping. There is no cellulitis no odor no drainage.  Imaging: No results found. No images are attached to the encounter.  Labs: Lab Results  Component Value Date   HGBA1C 12.2 (H) 11/17/2017   REPTSTATUS 11/21/2017 FINAL 11/16/2017   CULT  11/16/2017    NO GROWTH 5 DAYS Performed at [facility], [place] [address] [place], [place] [address]      Lab Results  Component Value Date   ALBUMIN 1.7 (L) 11/19/2017   ALBUMIN 2.0 (L) 11/18/2017   ALBUMIN 2.6 (L) 11/16/2017    There is no height or weight on file to calculate BMI.  Orders:  No orders of the defined types were placed in this encounter.  No orders of the defined types were placed in this  encounter.    Procedures: No procedures performed  Clinical Data: No additional findings.  ROS:  All other systems negative, except as noted in the HPI. Review of Systems  Constitutional: Negative for chills and fever.  Cardiovascular: Positive for leg swelling.  Skin: Negative for color change.    Objective: Vital Signs: There were no vitals taken for this visit.  Specialty Comments:  No specialty comments available.  PMFS History: Patient Active Problem List   Diagnosis Date Noted  . Gangrene of left foot ([place])   . Gangrene of right foot ([place])   . DKA (diabetic ketoacidoses) ([place]) 11/16/2017  . Diabetic infection of left foot ([place]) 11/16/2017  . Diabetic infection of right foot ([place]) 11/16/2017   Past Medical History:  Diagnosis Date  . Diabetes mellitus without complication ([place])     Family History  Problem Relation Age of Onset  . Diabetes Maternal Uncle     Past Surgical History:  Procedure Laterality Date  . AMPUTATION TOE Bilateral 11/18/2017   Procedure: BILATERAL TRANSMETETARSAL AMPUTATION;  Surgeon: Newt Minion, MD;  Location: [facility];  Service: Orthopedics;  Laterality: Bilateral;   Social History   Occupational History  . Not on file  Tobacco Use  . Smoking status: Never Smoker  . Smokeless tobacco: Never Used  Substance and Sexual Activity  . Alcohol  use: Not Currently  . Drug use: Never  . Sexual activity: Yes

## 2018-01-04 ENCOUNTER — Encounter (INDEPENDENT_AMBULATORY_CARE_PROVIDER_SITE_OTHER): Payer: Self-pay | Admitting: Family

## 2018-01-04 DIAGNOSIS — Z89431 Acquired absence of right foot: Secondary | ICD-10-CM | POA: Insufficient documentation

## 2018-01-04 DIAGNOSIS — Z89432 Acquired absence of left foot: Secondary | ICD-10-CM | POA: Insufficient documentation

## 2018-01-22 ENCOUNTER — Telehealth (INDEPENDENT_AMBULATORY_CARE_PROVIDER_SITE_OTHER): Payer: Self-pay | Admitting: Orthopedic Surgery

## 2018-01-22 ENCOUNTER — Other Ambulatory Visit (INDEPENDENT_AMBULATORY_CARE_PROVIDER_SITE_OTHER): Payer: Self-pay | Admitting: Orthopedic Surgery

## 2018-01-22 MED ORDER — OXYCODONE-ACETAMINOPHEN 5-325 MG PO TABS: 1.0000 | ORAL_TABLET | Freq: Three times a day (TID) | ORAL | 0 refills | Status: DC | PRN

## 2018-01-22 NOTE — Telephone Encounter (Signed)
Pt is s/p bilateral transmet amputations and is requesting a refill on his oxycodone. last refill 01/08/18 oxycodone 5mg  #30

## 2018-01-22 NOTE — Telephone Encounter (Signed)
Med refill  Oxycodone   Ian Bond

## 2018-01-22 NOTE — Telephone Encounter (Signed)
Called pt to advise that rx is at the desk for pick up tomorrow.

## 2018-01-22 NOTE — Telephone Encounter (Signed)
rx written

## 2018-01-23 ENCOUNTER — Other Ambulatory Visit (INDEPENDENT_AMBULATORY_CARE_PROVIDER_SITE_OTHER): Payer: Self-pay

## 2018-01-23 MED ORDER — OXYCODONE-ACETAMINOPHEN 5-325 MG PO TABS: 1.0000 | ORAL_TABLET | Freq: Three times a day (TID) | ORAL | 0 refills | Status: DC | PRN

## 2018-01-23 NOTE — Telephone Encounter (Signed)
Ria Comment gave rx for pt. [facility] is stating that DEA # is expired. Advised it is valid on website

## 2018-01-23 NOTE — Telephone Encounter (Signed)
Patient is at pharmacy and is stating that license is expired? Patients # [phone]

## 2018-02-02 ENCOUNTER — Telehealth (INDEPENDENT_AMBULATORY_CARE_PROVIDER_SITE_OTHER): Payer: Self-pay | Admitting: Orthopedic Surgery

## 2018-02-02 NOTE — Telephone Encounter (Signed)
Form received and will hold until Dr. Sharol Given returns to the office to complete.

## 2018-02-02 NOTE — Telephone Encounter (Signed)
Patient called needing Rx refilled (Oxycodone) The number to contact patient is [phone]

## 2018-02-02 NOTE — Telephone Encounter (Signed)
Called and sw pt to advise that I will hold message to request from provider on Monday. Pt voiced understanding and will await my call Monday morning.

## 2018-02-05 ENCOUNTER — Other Ambulatory Visit (INDEPENDENT_AMBULATORY_CARE_PROVIDER_SITE_OTHER): Payer: Self-pay

## 2018-02-05 MED ORDER — OXYCODONE-ACETAMINOPHEN 5-325 MG PO TABS: 1.0000 | ORAL_TABLET | Freq: Three times a day (TID) | ORAL | 0 refills | Status: DC | PRN

## 2018-02-05 NOTE — Telephone Encounter (Signed)
Called pt and advise that rx is at the front desk for pick up.

## 2018-02-05 NOTE — Telephone Encounter (Signed)
Ok to refill 

## 2018-02-05 NOTE — Telephone Encounter (Signed)
Pt is s/p bilat transmet amputations 11/18/17. Requesting refill of Percocet 5/325. Last refill was 01/23/18 #20 Dr. Sharol Given is out of the office can you please advise?

## 2018-02-07 ENCOUNTER — Ambulatory Visit (INDEPENDENT_AMBULATORY_CARE_PROVIDER_SITE_OTHER): Payer: Self-pay | Admitting: Family

## 2018-02-07 ENCOUNTER — Encounter (INDEPENDENT_AMBULATORY_CARE_PROVIDER_SITE_OTHER): Payer: Self-pay | Admitting: Family

## 2018-02-07 VITALS — Ht 75.0 in | Wt 274.0 lb

## 2018-02-07 DIAGNOSIS — Z89431 Acquired absence of right foot: Secondary | ICD-10-CM

## 2018-02-07 DIAGNOSIS — Z89432 Acquired absence of left foot: Secondary | ICD-10-CM

## 2018-02-14 ENCOUNTER — Encounter (INDEPENDENT_AMBULATORY_CARE_PROVIDER_SITE_OTHER): Payer: Self-pay | Admitting: Family

## 2018-02-14 NOTE — Progress Notes (Signed)
Office Visit Note   Patient: Ian Bond           Date of Birth: 06/22/1976           MRN: [id number] Visit Date: 02/07/2018              Requested by: No referring provider defined for this encounter. PCP: Patient, No Pcp Per  Chief Complaint  Patient presents with  . Left Foot - Routine Post Op    11/18/17 bilateral transmet amputation   . Right Foot - Routine Post Op      HPI:  The patient is a 42 year old gentleman who presents status post bilateral transmetatarsal amputations on April 27 of 2019.  Today is in a wheelchair for ambulation.  He does have regular shoewear and has worked with Museum/gallery curator has spacers currently being fabricated   Assessment & Plan: Visit Diagnoses:  No diagnosis found.  Plan: The right foot is well-healed he will continue to cleanse the left transmetatarsal amputation daily and apply Silvadene dressings.  This ulcer continues to heal.  Follow-Up Instructions: Return in about 1 month (around 03/07/2018).   Ortho Exam  Patient is alert, oriented, no adenopathy, well-dressed, normal affect, normal respiratory effort. There is a small amount of fibrinous tissue on the left transmetatarsal amputation approximately 3 cm x 8 mm to the lateral half. There is no drainage, erythema, odor or sign of infection. Right TMA is well-healed. No drainage no gaping. There is no cellulitis no odor no drainage.  Imaging: No results found. No images are attached to the encounter.  Labs: Lab Results  Component Value Date   HGBA1C 12.2 (H) 11/17/2017   REPTSTATUS 11/21/2017 FINAL 11/16/2017   CULT  11/16/2017    NO GROWTH 5 DAYS Performed at [facility], [place] [address] [place], [place] [address]      Lab Results  Component Value Date   ALBUMIN 1.7 (L) 11/19/2017   ALBUMIN 2.0 (L) 11/18/2017   ALBUMIN 2.6 (L) 11/16/2017    Body mass index is 34.25 kg/m.  Orders:  No orders of the defined types were placed in this encounter.  No orders  of the defined types were placed in this encounter.    Procedures: No procedures performed  Clinical Data: No additional findings.  ROS:  All other systems negative, except as noted in the HPI. Review of Systems  Constitutional: Negative for chills and fever.  Cardiovascular: Positive for leg swelling.  Skin: Negative for color change.    Objective: Vital Signs: Ht 6\' 3"  (1.905 m)   Wt 274 lb (124.3 kg)   BMI 34.25 kg/m   Specialty Comments:  No specialty comments available.  PMFS History: Patient Active Problem List   Diagnosis Date Noted  . History of transmetatarsal amputation of right foot ([place]) 01/04/2018  . DKA (diabetic ketoacidoses) ([place]) 11/16/2017   Past Medical History:  Diagnosis Date  . Diabetes mellitus without complication ([place])     Family History  Problem Relation Age of Onset  . Diabetes Maternal Uncle     Past Surgical History:  Procedure Laterality Date  . AMPUTATION TOE Bilateral 11/18/2017   Procedure: BILATERAL TRANSMETETARSAL AMPUTATION;  Surgeon: Newt Minion, MD;  Location: [facility];  Service: Orthopedics;  Laterality: Bilateral;   Social History   Occupational History  . Not on file  Tobacco Use  . Smoking status: Never Smoker  . Smokeless tobacco: Never Used  Substance and Sexual Activity  . Alcohol use: Not  Currently  . Drug use: Never  . Sexual activity: Yes

## 2018-02-23 ENCOUNTER — Telehealth (INDEPENDENT_AMBULATORY_CARE_PROVIDER_SITE_OTHER): Payer: Self-pay | Admitting: Orthopedic Surgery

## 2018-02-23 ENCOUNTER — Other Ambulatory Visit (INDEPENDENT_AMBULATORY_CARE_PROVIDER_SITE_OTHER): Payer: Self-pay

## 2018-02-23 MED ORDER — GABAPENTIN 300 MG PO CAPS
300.0000 mg | ORAL_CAPSULE | Freq: Three times a day (TID) | ORAL | Status: AC
Start: 2018-02-23 — End: ?

## 2018-02-23 MED ORDER — OXYCODONE-ACETAMINOPHEN 5-325 MG PO TABS: 1.0000 | ORAL_TABLET | Freq: Three times a day (TID) | ORAL | 0 refills | Status: AC | PRN

## 2018-02-23 NOTE — Telephone Encounter (Signed)
Please advise pt is in lobby s/p bilateral transmet amputations

## 2018-02-23 NOTE — Telephone Encounter (Signed)
Medication Refill  Gabapentin  Oxycodone

## 2018-02-23 NOTE — Telephone Encounter (Signed)
rx written and given to pt.

## 2018-02-23 NOTE — Telephone Encounter (Signed)
ok 

## 2018-02-23 NOTE — Telephone Encounter (Signed)
There is nothing documented in chart to advise of increase will hold and ask Dr. Sharol Given on Monday.

## 2018-02-23 NOTE — Telephone Encounter (Signed)
Patient called states that he was advised to take 2 gabapentin tid, he states that the pharm is saying that it is to early to refill meds because of the change in directions.  Please call and advise

## 2018-02-26 NOTE — Telephone Encounter (Signed)
I called and advised of message below. Pt will call with any other questions.

## 2018-02-26 NOTE — Telephone Encounter (Signed)
Refilled pt's Neurontin Friday and he states that he was advise to increase from 1 tab tid to two tabs tid. This was not documented in the pt's chart do you wish to write rx to reflect this increase?

## 2018-02-26 NOTE — Telephone Encounter (Signed)
Continue with 300 mg tid

## 2018-03-08 ENCOUNTER — Telehealth (INDEPENDENT_AMBULATORY_CARE_PROVIDER_SITE_OTHER): Payer: Self-pay | Admitting: Orthopedic Surgery

## 2018-03-08 NOTE — Telephone Encounter (Signed)
Patient is 4 months status post bilateral transmetatarsal amputations.  Would need to see patient to determine why he is having pain.  Follow-up in the office

## 2018-03-08 NOTE — Telephone Encounter (Signed)
Rx request 

## 2018-03-08 NOTE — Telephone Encounter (Signed)
IC patient and advised, LMVM

## 2018-03-08 NOTE — Telephone Encounter (Signed)
Patient called needing Rx refilled (Oxycodone) The number to contact patient  Is [phone]

## 2018-03-12 ENCOUNTER — Encounter (INDEPENDENT_AMBULATORY_CARE_PROVIDER_SITE_OTHER): Payer: Self-pay | Admitting: Orthopedic Surgery

## 2018-03-12 ENCOUNTER — Ambulatory Visit (INDEPENDENT_AMBULATORY_CARE_PROVIDER_SITE_OTHER): Payer: Self-pay | Admitting: Orthopedic Surgery

## 2018-03-12 VITALS — Ht 75.0 in | Wt 274.0 lb

## 2018-03-12 DIAGNOSIS — I87323 Chronic venous hypertension (idiopathic) with inflammation of bilateral lower extremity: Secondary | ICD-10-CM

## 2018-03-12 DIAGNOSIS — Z89431 Acquired absence of right foot: Secondary | ICD-10-CM

## 2018-03-12 DIAGNOSIS — Z89432 Acquired absence of left foot: Secondary | ICD-10-CM

## 2018-03-12 NOTE — Progress Notes (Signed)
Office Visit Note   Ian Bond: Ian Bond           Date of Birth: 12/20/1975           MRN: [id number] Visit Date: 03/12/2018              Requested by: No referring provider defined for this encounter. PCP: Ian Bond, No Pcp Per  Chief Complaint  Ian Bond presents with  . Left Foot - Follow-up    11/18/17 bilateral transmet amputations   . Right Foot - Follow-up      HPI: Ian Bond is a 42 year old gentleman with bilateral transmetatarsal amputations a [place] grade 1 ulcer on the left foot with venous insufficiency bilateral lower extremities.  Assessment & Plan: Visit Diagnoses:  1. History of transmetatarsal amputation of right foot ([place])   2. History of transmetatarsal amputation of left foot ([place])   3. Idiopathic chronic venous hypertension of both lower extremities with inflammation     Plan: Recommended knee-high compression stockings 15 to 20 mm of compression Ian Bond does have a prescription for extra-depth shoes orthotics spacer and carbon plate but he states he is unable to get this until he has Medicaid.  With Ian Bond's current medical condition he would be permanently disabled from any type of work that he is capable of doing at this time.  Follow-Up Instructions: Return in about 4 weeks (around 04/09/2018).   Ortho Exam  Ian Bond is alert, oriented, no adenopathy, well-dressed, normal affect, normal respiratory effort. Examination Ian Bond has an antalgic gait he has venous stasis swelling of both legs but no venous ulcers.  He has dorsiflexion to neutral and the importance of working on heel cord stretching was discussed.  He has a ulcer over the left transmetatarsal amputation there is read by 15 mm and 1 mm deep there is fibrinous tissue at the base.  There is no cellulitis no odor no drainage no signs of infection there is no exposed bone or tendon.  Imaging: No results found. No images are attached to the encounter.  Labs: Lab Results  Component Value  Date   HGBA1C 12.2 (H) 11/17/2017   REPTSTATUS 11/21/2017 FINAL 11/16/2017   CULT  11/16/2017    NO GROWTH 5 DAYS Performed at [facility], [place] [address], [place], [place] [address]      Lab Results  Component Value Date   ALBUMIN 1.7 (L) 11/19/2017   ALBUMIN 2.0 (L) 11/18/2017   ALBUMIN 2.6 (L) 11/16/2017    Body mass index is 34.25 kg/m.  Orders:  No orders of the defined types were placed in this encounter.  No orders of the defined types were placed in this encounter.    Procedures: No procedures performed  Clinical Data: No additional findings.  ROS:  All other systems negative, except as noted in the HPI. Review of Systems  Objective: Vital Signs: Ht 6\' 3"  (1.905 m)   Wt 274 lb (124.3 kg)   BMI 34.25 kg/m   Specialty Comments:  No specialty comments available.  PMFS History: Ian Bond Active Problem List   Diagnosis Date Noted  . History of transmetatarsal amputation of right foot ([place]) 01/04/2018  . History of transmetatarsal amputation of left foot ([place]) 01/04/2018  . DKA (diabetic ketoacidoses) ([place]) 11/16/2017   Past Medical History:  Diagnosis Date  . Diabetes mellitus without complication ([place])     Family History  Problem Relation Age of Onset  . Diabetes Maternal Uncle     Past Surgical History:  Procedure  Laterality Date  . AMPUTATION TOE Bilateral 11/18/2017   Procedure: BILATERAL TRANSMETETARSAL AMPUTATION;  Surgeon: Newt Minion, MD;  Location: [facility];  Service: Orthopedics;  Laterality: Bilateral;   Social History   Occupational History  . Not on file  Tobacco Use  . Smoking status: Never Smoker  . Smokeless tobacco: Never Used  Substance and Sexual Activity  . Alcohol use: Not Currently  . Drug use: Never  . Sexual activity: Yes

## 2018-04-09 ENCOUNTER — Ambulatory Visit (INDEPENDENT_AMBULATORY_CARE_PROVIDER_SITE_OTHER): Payer: Self-pay | Admitting: Orthopedic Surgery

## 2018-04-18 ENCOUNTER — Ambulatory Visit (INDEPENDENT_AMBULATORY_CARE_PROVIDER_SITE_OTHER): Payer: Self-pay | Admitting: Orthopedic Surgery

## 2018-04-23 ENCOUNTER — Ambulatory Visit (INDEPENDENT_AMBULATORY_CARE_PROVIDER_SITE_OTHER): Payer: Self-pay | Admitting: Orthopedic Surgery

## 2018-04-30 ENCOUNTER — Encounter (INDEPENDENT_AMBULATORY_CARE_PROVIDER_SITE_OTHER): Payer: Self-pay | Admitting: Orthopedic Surgery

## 2018-04-30 ENCOUNTER — Ambulatory Visit (INDEPENDENT_AMBULATORY_CARE_PROVIDER_SITE_OTHER): Payer: Medicaid Other | Admitting: Physician Assistant

## 2018-04-30 VITALS — Ht 75.0 in | Wt 274.0 lb

## 2018-04-30 DIAGNOSIS — Z89432 Acquired absence of left foot: Secondary | ICD-10-CM

## 2018-04-30 DIAGNOSIS — Z794 Long term (current) use of insulin: Secondary | ICD-10-CM

## 2018-04-30 DIAGNOSIS — E1142 Type 2 diabetes mellitus with diabetic polyneuropathy: Secondary | ICD-10-CM

## 2018-04-30 DIAGNOSIS — Z89431 Acquired absence of right foot: Secondary | ICD-10-CM | POA: Diagnosis not present

## 2018-04-30 DIAGNOSIS — I87323 Chronic venous hypertension (idiopathic) with inflammation of bilateral lower extremity: Secondary | ICD-10-CM | POA: Diagnosis not present

## 2018-04-30 NOTE — Progress Notes (Signed)
Office Visit Note   Patient: Ian Bond           Date of Birth: January 14, 1976           MRN: [id number] Visit Date: 04/30/2018              Requested by: No referring provider defined for this encounter. PCP: Patient, No Pcp Per  Chief Complaint  Patient presents with  . Left Foot - Follow-up  . Right Foot - Follow-up      HPI: Patient is a 42 year old male who seen for follow-up following bilateral transmetatarsal amputations back in April 2019.  He reports that more recently he has been doing very well with his bilateral custom shoes with orthotics and spacers from [facility].Marland Kitchen  He has no calluses no wounds.  He is doing daily foot checks for any new areas of irritation.  He has no concerns.  He is utilizing knee-high compression stockings 15 to 20 mm of compression.  Assessment & Plan: Visit Diagnoses:  1. History of transmetatarsal amputation of right foot ([place])   2. History of transmetatarsal amputation of left foot ([place])   3. Type 2 diabetes mellitus with diabetic polyneuropathy, with long-term current use of insulin ([place])   4. Idiopathic chronic venous hypertension of both lower extremities with inflammation     Plan: Patient will continue to monitor his residual foot closely for any areas of callus or irritation.  Continue with bilateral custom shoes and orthotics with spacers and bilateral compression stockings 15 to 20 mmHg compression.  He will follow-up on an as-needed basis.  Follow-Up Instructions: Return if symptoms worsen or fail to improve.   Ortho Exam  Patient is alert, oriented, no adenopathy, well-dressed, normal affect, normal respiratory effort. Patient bilateral transmetatarsal amputation sites are well-healed.  There is no wounds.  No calluses.  Good dorsalis pedis pulses.  No cellulitis or edema.  Imaging: No results found. No images are attached to the encounter.  Labs: Lab Results  Component Value Date   HGBA1C 12.2 (H) 11/17/2017   REPTSTATUS 11/21/2017 FINAL 11/16/2017   CULT  11/16/2017    NO GROWTH 5 DAYS Performed at [facility], [place] [address], [place], [place] [address]      Lab Results  Component Value Date   ALBUMIN 1.7 (L) 11/19/2017   ALBUMIN 2.0 (L) 11/18/2017   ALBUMIN 2.6 (L) 11/16/2017    Body mass index is 34.25 kg/m.  Orders:  No orders of the defined types were placed in this encounter.  No orders of the defined types were placed in this encounter.    Procedures: No procedures performed  Clinical Data: No additional findings.  ROS:  All other systems negative, except as noted in the HPI. Review of Systems  Objective: Vital Signs: Ht 6\' 3"  (1.905 m)   Wt 274 lb (124.3 kg)   BMI 34.25 kg/m   Specialty Comments:  No specialty comments available.  PMFS History: Patient Active Problem List   Diagnosis Date Noted  . History of transmetatarsal amputation of right foot ([place]) 01/04/2018  . History of transmetatarsal amputation of left foot ([place]) 01/04/2018  . DKA (diabetic ketoacidoses) ([place]) 11/16/2017   Past Medical History:  Diagnosis Date  . Diabetes mellitus without complication ([place])     Family History  Problem Relation Age of Onset  . Diabetes Maternal Uncle     Past Surgical History:  Procedure Laterality Date  . AMPUTATION TOE Bilateral 11/18/2017   Procedure:  BILATERAL TRANSMETETARSAL AMPUTATION;  Surgeon: Newt Minion, MD;  Location: [facility];  Service: Orthopedics;  Laterality: Bilateral;   Social History   Occupational History  . Not on file  Tobacco Use  . Smoking status: Never Smoker  . Smokeless tobacco: Never Used  Substance and Sexual Activity  . Alcohol use: Not Currently  . Drug use: Never  . Sexual activity: Yes

## 2019-03-07 IMAGING — DX DG ABD PORTABLE 1V
1 series · 1 of 1 positions shown · non-contrast
Comparison: Abdomen films of 11/26/2017

CLINICAL DATA: History of ileus, follow-up

EXAM:
PORTABLE ABDOMEN - 1 VIEW

[abdomen kub]
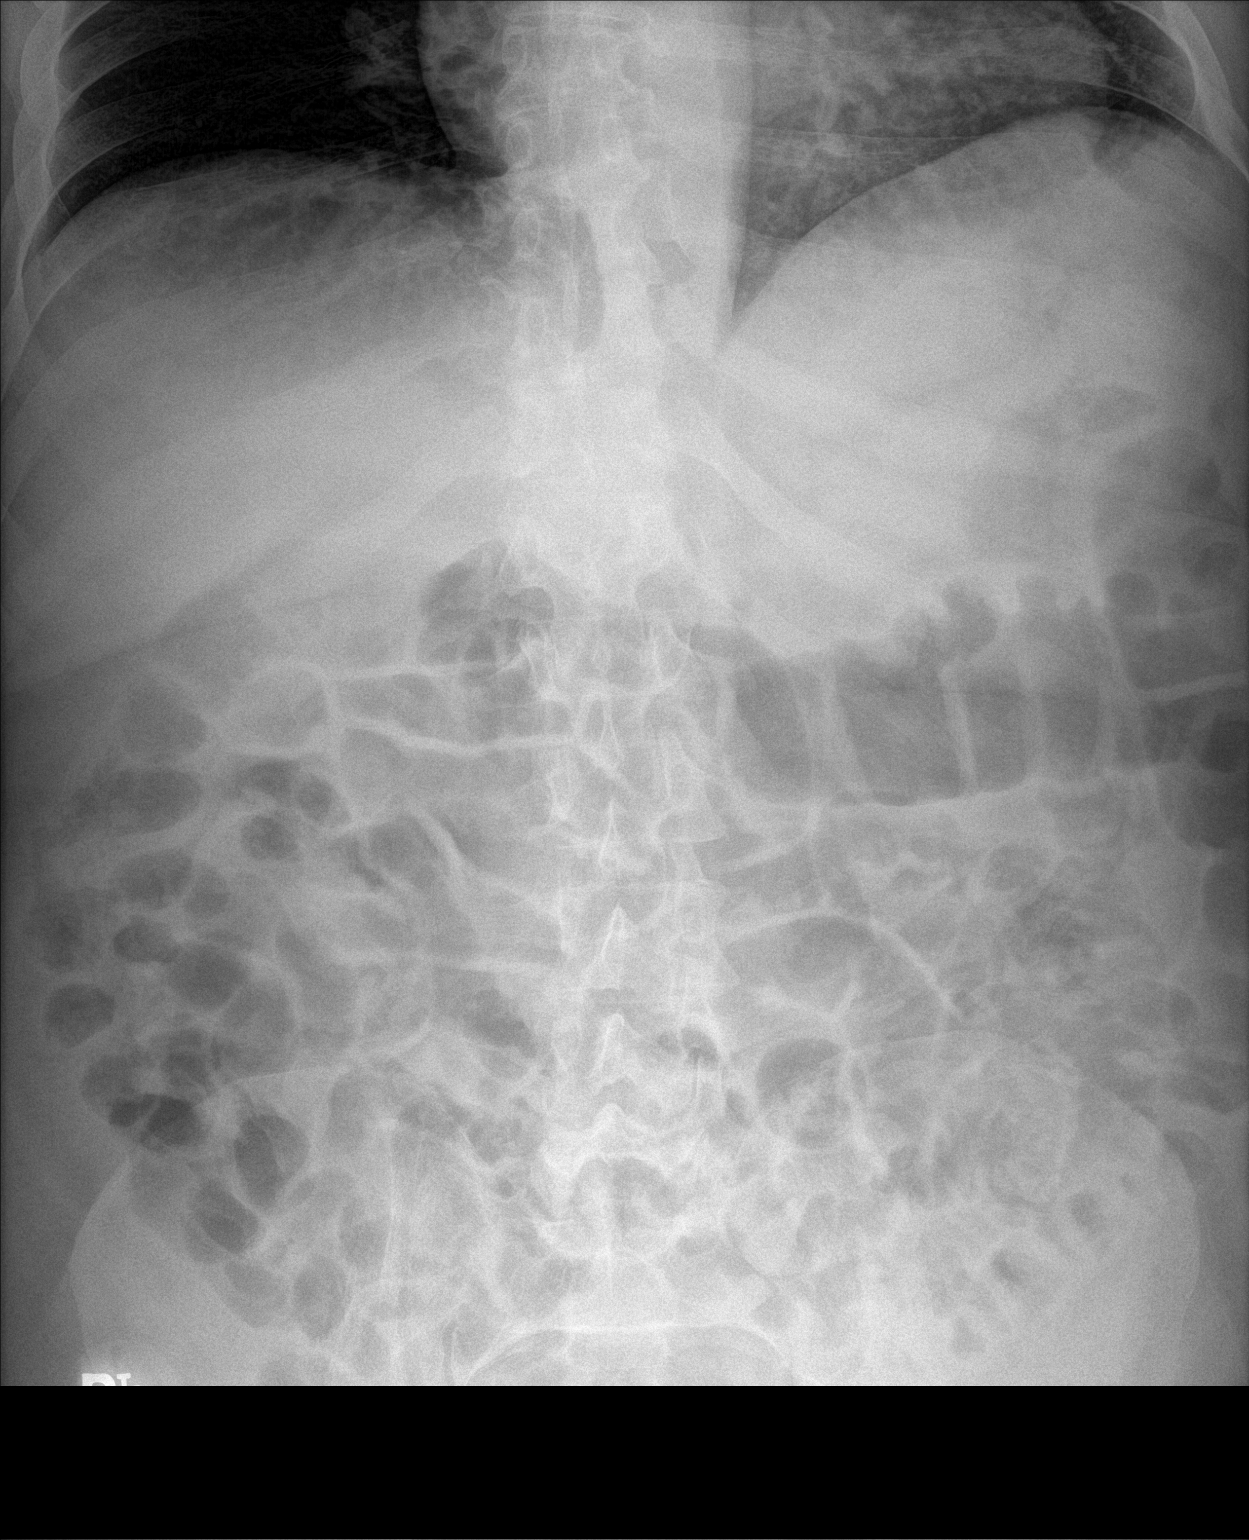

[1 of 1 positions shown; findings below may reference images not displayed]

FINDINGS: Both large and small bowel gas is present currently with no
distention. No opaque calculi are noted. The bones appear normal.
IMPRESSION: Large and small bowel gas is present without distension.

## 2019-06-26 ENCOUNTER — Telehealth: Payer: Self-pay | Admitting: Orthopedic Surgery

## 2019-06-26 NOTE — Telephone Encounter (Signed)
Patient called needing a Rx for Bil Toe Filler sent to [facility]. The fax# is [phone] The number to contact patient is [phone]

## 2019-06-26 NOTE — Telephone Encounter (Signed)
Rx filled and will be faxed after signature.

## 2020-09-22 ENCOUNTER — Other Ambulatory Visit: Payer: Self-pay | Admitting: Student

## 2020-09-22 DIAGNOSIS — M7989 Other specified soft tissue disorders: Secondary | ICD-10-CM

## 2020-11-18 ENCOUNTER — Encounter (HOSPITAL_COMMUNITY): Payer: Self-pay

## 2020-11-18 ENCOUNTER — Ambulatory Visit (HOSPITAL_COMMUNITY)
Admission: EM | Admit: 2020-11-18 | Discharge: 2020-11-18 | Disposition: A | Discharge status: Home / Self Care | Payer: Medicare HMO | Attending: Internal Medicine | Admitting: Internal Medicine

## 2020-11-18 DIAGNOSIS — Z0189 Encounter for other specified special examinations: Secondary | ICD-10-CM

## 2020-11-18 DIAGNOSIS — U071 COVID-19: Secondary | ICD-10-CM | POA: Diagnosis not present

## 2020-11-18 LAB — SARS CORONAVIRUS 2 (TAT 6-24 HRS): SARS Coronavirus 2: POSITIVE — AB

## 2020-11-18 NOTE — ED Triage Notes (Signed)
Pt presents for COVID testing. Pt denies fever, shortness of breath, cough, or any other symptoms.    

## 2020-11-19 ENCOUNTER — Telehealth: Payer: Self-pay

## 2020-11-19 NOTE — Telephone Encounter (Signed)
Called to discuss with patient about COVID-19 symptoms and the use of one of the available treatments for those with mild to moderate Covid symptoms and at a high risk of hospitalization.  Pt appears to qualify for outpatient treatment due to co-morbid conditions and/or a member of an at-risk group in accordance with the FDA Emergency Use Authorization.    Symptom onset: No symptoms Vaccinated: No Booster? No Immunocompromised?  Qualifiers: DM NIH Criteria:   Pt. Declines any further treatment.  Ian Bond

## 2021-04-15 ENCOUNTER — Other Ambulatory Visit: Payer: Self-pay | Admitting: Student

## 2021-04-15 DIAGNOSIS — E1340 Other specified diabetes mellitus with diabetic neuropathy, unspecified: Secondary | ICD-10-CM
